# Patient Record
Sex: Female | Born: 1935 | Race: White | Hispanic: No | Marital: Married | Smoking: Never smoker
Health system: Southern US, Community
[De-identification: ages and names within clinical notes are randomized; demographics above are authoritative.]

## PROBLEM LIST (undated history)

## (undated) DIAGNOSIS — E119 Type 2 diabetes mellitus without complications: Secondary | ICD-10-CM

## (undated) DIAGNOSIS — I499 Cardiac arrhythmia, unspecified: Secondary | ICD-10-CM

## (undated) DIAGNOSIS — J302 Other seasonal allergic rhinitis: Secondary | ICD-10-CM

## (undated) DIAGNOSIS — I10 Essential (primary) hypertension: Secondary | ICD-10-CM

## (undated) DIAGNOSIS — K219 Gastro-esophageal reflux disease without esophagitis: Secondary | ICD-10-CM

## (undated) DIAGNOSIS — R011 Cardiac murmur, unspecified: Secondary | ICD-10-CM

## (undated) DIAGNOSIS — M1990 Unspecified osteoarthritis, unspecified site: Secondary | ICD-10-CM

## (undated) HISTORY — PX: ABDOMINAL HYSTERECTOMY: SHX81

## (undated) HISTORY — PX: EYE SURGERY: SHX253

## (undated) HISTORY — PX: CHOLECYSTECTOMY: SHX55

## (undated) HISTORY — PX: BLADDER SUSPENSION: SHX72

## (undated) HISTORY — PX: APPENDECTOMY: SHX54

---

## 1989-04-17 HISTORY — PX: BREAST BIOPSY: SHX20

## 2001-04-17 HISTORY — PX: BREAST BIOPSY: SHX20

## 2013-08-01 ENCOUNTER — Ambulatory Visit: Payer: Self-pay | Admitting: Family Medicine

## 2013-08-05 ENCOUNTER — Ambulatory Visit: Payer: Self-pay | Admitting: Family Medicine

## 2013-09-04 DIAGNOSIS — E119 Type 2 diabetes mellitus without complications: Secondary | ICD-10-CM | POA: Insufficient documentation

## 2013-09-04 DIAGNOSIS — E782 Mixed hyperlipidemia: Secondary | ICD-10-CM | POA: Insufficient documentation

## 2013-09-04 DIAGNOSIS — I10 Essential (primary) hypertension: Secondary | ICD-10-CM | POA: Insufficient documentation

## 2013-11-27 ENCOUNTER — Ambulatory Visit: Payer: Self-pay | Admitting: Otolaryngology

## 2014-08-20 ENCOUNTER — Other Ambulatory Visit: Payer: Self-pay | Admitting: Family Medicine

## 2014-08-20 DIAGNOSIS — Z780 Asymptomatic menopausal state: Secondary | ICD-10-CM

## 2014-08-25 ENCOUNTER — Other Ambulatory Visit: Payer: Self-pay

## 2014-08-25 DIAGNOSIS — Z1231 Encounter for screening mammogram for malignant neoplasm of breast: Secondary | ICD-10-CM

## 2014-09-22 ENCOUNTER — Ambulatory Visit
Admission: RE | Admit: 2014-09-22 | Discharge: 2014-09-22 | Disposition: A | Discharge status: Home / Self Care | Payer: Medicare Other | Source: Ambulatory Visit | Attending: Family Medicine | Admitting: Family Medicine

## 2014-09-22 ENCOUNTER — Ambulatory Visit
Admission: RE | Admit: 2014-09-22 | Discharge: 2014-09-22 | Disposition: A | Discharge status: Home / Self Care | Payer: Medicare Other | Source: Ambulatory Visit

## 2014-09-22 DIAGNOSIS — Z780 Asymptomatic menopausal state: Secondary | ICD-10-CM

## 2014-09-22 DIAGNOSIS — Z1231 Encounter for screening mammogram for malignant neoplasm of breast: Secondary | ICD-10-CM

## 2015-10-07 ENCOUNTER — Other Ambulatory Visit: Payer: Self-pay | Admitting: Family Medicine

## 2015-10-07 DIAGNOSIS — Z1231 Encounter for screening mammogram for malignant neoplasm of breast: Secondary | ICD-10-CM

## 2015-10-25 ENCOUNTER — Ambulatory Visit
Admission: RE | Admit: 2015-10-25 | Discharge: 2015-10-25 | Disposition: A | Discharge status: Home / Self Care | Payer: Medicare Other | Source: Ambulatory Visit | Attending: Family Medicine | Admitting: Family Medicine

## 2015-10-25 DIAGNOSIS — Z1231 Encounter for screening mammogram for malignant neoplasm of breast: Secondary | ICD-10-CM | POA: Insufficient documentation

## 2015-10-28 DIAGNOSIS — L298 Other pruritus: Secondary | ICD-10-CM | POA: Insufficient documentation

## 2016-05-03 ENCOUNTER — Encounter: Payer: Self-pay | Admitting: Emergency Medicine

## 2016-05-03 ENCOUNTER — Emergency Department
Admission: EM | Admit: 2016-05-03 | Discharge: 2016-05-03 | Disposition: A | Discharge status: Home / Self Care | Payer: Medicare Other | Attending: Emergency Medicine | Admitting: Emergency Medicine

## 2016-05-03 ENCOUNTER — Emergency Department: Payer: Medicare Other

## 2016-05-03 DIAGNOSIS — R51 Headache: Secondary | ICD-10-CM | POA: Insufficient documentation

## 2016-05-03 DIAGNOSIS — R55 Syncope and collapse: Secondary | ICD-10-CM | POA: Insufficient documentation

## 2016-05-03 DIAGNOSIS — I10 Essential (primary) hypertension: Secondary | ICD-10-CM | POA: Insufficient documentation

## 2016-05-03 DIAGNOSIS — E119 Type 2 diabetes mellitus without complications: Secondary | ICD-10-CM | POA: Insufficient documentation

## 2016-05-03 HISTORY — DX: Type 2 diabetes mellitus without complications: E11.9

## 2016-05-03 HISTORY — DX: Essential (primary) hypertension: I10

## 2016-05-03 LAB — URINALYSIS, COMPLETE (UACMP) WITH MICROSCOPIC
BILIRUBIN URINE: NEGATIVE
Bacteria, UA: NONE SEEN
Glucose, UA: NEGATIVE mg/dL
Hgb urine dipstick: NEGATIVE
KETONES UR: NEGATIVE mg/dL
LEUKOCYTES UA: NEGATIVE
Nitrite: NEGATIVE
PH: 6 (ref 5.0–8.0)
PROTEIN: NEGATIVE mg/dL
Specific Gravity, Urine: 1.01 (ref 1.005–1.030)

## 2016-05-03 LAB — BASIC METABOLIC PANEL
ANION GAP: 8 (ref 5–15)
BUN: 24 mg/dL — ABNORMAL HIGH (ref 6–20)
CHLORIDE: 103 mmol/L (ref 101–111)
CO2: 28 mmol/L (ref 22–32)
CREATININE: 0.93 mg/dL (ref 0.44–1.00)
Calcium: 8.8 mg/dL — ABNORMAL LOW (ref 8.9–10.3)
GFR calc non Af Amer: 56 mL/min — ABNORMAL LOW (ref >60)
Glucose, Bld: 148 mg/dL — ABNORMAL HIGH (ref 65–99)
POTASSIUM: 4.1 mmol/L (ref 3.5–5.1)
SODIUM: 139 mmol/L (ref 135–145)

## 2016-05-03 LAB — CBC
HEMATOCRIT: 38.5 % (ref 35.0–47.0)
HEMOGLOBIN: 13.1 g/dL (ref 12.0–16.0)
MCH: 31.7 pg (ref 26.0–34.0)
MCHC: 34 g/dL (ref 32.0–36.0)
MCV: 93.1 fL (ref 80.0–100.0)
PLATELETS: 351 10*3/uL (ref 150–440)
RBC: 4.14 MIL/uL (ref 3.80–5.20)
RDW: 13.7 % (ref 11.5–14.5)
WBC: 11.7 10*3/uL — AB (ref 3.6–11.0)

## 2016-05-03 LAB — TROPONIN I: Troponin I: <0.03 ng/mL (ref <0.03)

## 2016-05-03 NOTE — ED Notes (Signed)
Pt given 8 12oz cup of water. No other needs expressed.

## 2016-05-03 NOTE — ED Triage Notes (Signed)
Pt arrived via ems from home with complaints of a syncopal episode after getting up to use the bathroom. Pt denies hitting her head, reports hitting her right arm. EMS vitals; blood pressure 183/64, 97% on room air, 98.3 oral temp. Pt reports this has happened before when she was dehydrated. EMS has a bolus running through IV established in route.

## 2016-05-03 NOTE — ED Provider Notes (Signed)
[facility] Emergency Department Provider Note   ____________________________________________   I have reviewed the triage vital signs and the nursing notes.   HISTORY  Chief Complaint Loss of Consciousness   History limited by: Not Limited   HPI Sonya Matthews is a 83 y.o. female who presents to the emergency department today after a syncopal episode. The patient states that she was eating dinner when she started feeling unwell. The patient felt lightheaded. They checked her blood pressure was low. The patient then went to the bathroom. After having used the restroom she passed out. The patient was helped to the ground by her husband. She is not complaining of any pain or injuries from the fall. The patient denies any concurrent chest pain or palpitations. Denies similar symptoms in the past. In addition the patient has been complaining of a sinus headache for the past two days.   Past Medical History:  Diagnosis Date  . Diabetes mellitus without complication (HCC)   . Hypertension     There are no active problems to display for this patient.   Past Surgical History:  Procedure Laterality Date  . ABDOMINAL HYSTERECTOMY    . BREAST BIOPSY Left 2003   neg  . BREAST BIOPSY Left 1991   neg-needle bx  . CHOLECYSTECTOMY      Prior to Admission medications   Not on File    Allergies Patient has no allergy information on record.  Family History  Problem Relation Age of Onset  . Breast cancer Mother   . Breast cancer Maternal Aunt     Social History Social History  Substance Use Topics  . Smoking status: Never Smoker  . Smokeless tobacco: Never Used  . Alcohol use Yes    Review of Systems  Constitutional: Negative for fever. Cardiovascular: Negative for chest pain. Respiratory: Negative for shortness of breath. Gastrointestinal: Negative for abdominal pain, vomiting and diarrhea. Neurological: Positive for headache. 10-point ROS  otherwise negative.  ____________________________________________   PHYSICAL EXAM:  VITAL SIGNS: ED Triage Vitals  Enc Vitals Group     BP 05/03/16 1928 (!) 155/53     Pulse Rate 05/03/16 1928 68     Resp 05/03/16 1928 15     Temp --      Temp src --      SpO2 05/03/16 1928 96 %     Weight 05/03/16 1925 155 lb (70.3 kg)     Height 05/03/16 1925 5\' 4"  (1.626 m)    Constitutional: Alert and oriented. Well appearing and in no distress. Eyes: Conjunctivae are normal. Normal extraocular movements. ENT   Head: Normocephalic and atraumatic.   Nose: No congestion/rhinnorhea.   Mouth/Throat: Mucous membranes are moist.   Neck: No stridor. Hematological/Lymphatic/Immunilogical: No cervical lymphadenopathy. Cardiovascular: Normal rate, regular rhythm.  No murmurs, rubs, or gallops.  Respiratory: Normal respiratory effort without tachypnea nor retractions. Breath sounds are clear and equal bilaterally. No wheezes/rales/rhonchi. Gastrointestinal: Soft and non tender. No rebound. No guarding.  Genitourinary: Deferred Musculoskeletal: Normal range of motion in all extremities. No lower extremity edema. Neurologic:  Normal speech and language. No gross focal neurologic deficits are appreciated.  Skin:  Skin is warm, dry and intact. No rash noted. Psychiatric: Mood and affect are normal. Speech and behavior are normal. Patient exhibits appropriate insight and judgment.  ____________________________________________    LABS (pertinent positives/negatives)  Labs Reviewed  BASIC METABOLIC PANEL - Abnormal; Notable for the following:       Result Value  Glucose, Bld 148 (*)    BUN 24 (*)    Calcium 8.8 (*)    GFR calc non Af Amer 56 (*)    All other components within normal limits  CBC - Abnormal; Notable for the following:    WBC 11.7 (*)    All other components within normal limits  URINALYSIS, COMPLETE (UACMP) WITH MICROSCOPIC - Abnormal; Notable for the following:     Color, Urine YELLOW (*)    APPearance CLEAR (*)    Squamous Epithelial / LPF 0-5 (*)    All other components within normal limits  TROPONIN I  TROPONIN I  CBG MONITORING, ED     ____________________________________________   EKG  I, Phineas SemenGraydon Asjah Rauda, attending physician, personally viewed and interpreted this EKG  EKG Time: 1931 Rate: 69 Rhythm: normal sinus rhythm Axis: normal Intervals: qtc 420 QRS: narrow ST changes: no st elevation Impression: normal ekg   ____________________________________________    RADIOLOGY  CT head IMPRESSION:  Negative noncontrast head CT.    ___________________________________________   PROCEDURES  Procedures  ____________________________________________   INITIAL IMPRESSION / ASSESSMENT AND PLAN / ED COURSE  Pertinent labs & imaging results that were available during my care of the patient were reviewed by me and considered in my medical decision making (see chart for details).  Patient presented to the emergency department today because of concerns for a syncopal episode. The patient blood work and urine without concerning findings. Patient have 2 sets of troponin. Initially a head CT was obtained given patient's complaints of headache and this did not show any acute findings. The fact that this was around the time the patient is a bathroom and he wantedbagel. Did discuss with patient importance of following up with primary care doctor.  ____________________________________________   FINAL CLINICAL IMPRESSION(S) / ED DIAGNOSES  Final diagnoses:  Syncope, unspecified syncope type     Note: This dictation was prepared with Dragon dictation. Any transcriptional errors that result from this process are unintentional     Phineas SemenGraydon Reeda Soohoo, MD 05/04/16 [phone]

## 2016-05-03 NOTE — Discharge Instructions (Signed)
Please seek medical attention for any high fevers, chest pain, shortness of breath, change in behavior, persistent vomiting, bloody stool or any other new or concerning symptoms.  

## 2016-05-03 NOTE — ED Notes (Signed)
Pt given water 

## 2016-09-18 ENCOUNTER — Other Ambulatory Visit: Payer: Self-pay | Admitting: Family Medicine

## 2016-09-18 DIAGNOSIS — Z1231 Encounter for screening mammogram for malignant neoplasm of breast: Secondary | ICD-10-CM

## 2016-10-16 ENCOUNTER — Other Ambulatory Visit: Payer: Self-pay | Admitting: Family Medicine

## 2016-10-16 DIAGNOSIS — Z780 Asymptomatic menopausal state: Secondary | ICD-10-CM

## 2016-10-25 ENCOUNTER — Ambulatory Visit
Admission: RE | Admit: 2016-10-25 | Discharge: 2016-10-25 | Disposition: A | Discharge status: Home / Self Care | Payer: Medicare Other | Source: Ambulatory Visit | Attending: Family Medicine | Admitting: Family Medicine

## 2016-10-25 DIAGNOSIS — Z1231 Encounter for screening mammogram for malignant neoplasm of breast: Secondary | ICD-10-CM

## 2016-11-14 ENCOUNTER — Ambulatory Visit
Admission: RE | Admit: 2016-11-14 | Discharge: 2016-11-14 | Disposition: A | Discharge status: Home / Self Care | Payer: Medicare Other | Source: Ambulatory Visit | Attending: Family Medicine | Admitting: Family Medicine

## 2016-11-14 ENCOUNTER — Other Ambulatory Visit (INDEPENDENT_AMBULATORY_CARE_PROVIDER_SITE_OTHER): Payer: Self-pay | Admitting: Family Medicine

## 2016-11-14 DIAGNOSIS — M85852 Other specified disorders of bone density and structure, left thigh: Secondary | ICD-10-CM | POA: Diagnosis not present

## 2016-11-14 DIAGNOSIS — E119 Type 2 diabetes mellitus without complications: Secondary | ICD-10-CM | POA: Insufficient documentation

## 2016-11-14 DIAGNOSIS — M7989 Other specified soft tissue disorders: Secondary | ICD-10-CM

## 2016-11-14 DIAGNOSIS — I714 Abdominal aortic aneurysm, without rupture, unspecified: Secondary | ICD-10-CM

## 2016-11-14 DIAGNOSIS — Z780 Asymptomatic menopausal state: Secondary | ICD-10-CM | POA: Diagnosis not present

## 2016-11-14 DIAGNOSIS — R0989 Other specified symptoms and signs involving the circulatory and respiratory systems: Secondary | ICD-10-CM

## 2016-11-15 ENCOUNTER — Ambulatory Visit (INDEPENDENT_AMBULATORY_CARE_PROVIDER_SITE_OTHER): Payer: Medicare Other

## 2016-11-15 DIAGNOSIS — M7989 Other specified soft tissue disorders: Secondary | ICD-10-CM | POA: Diagnosis not present

## 2016-11-15 DIAGNOSIS — I714 Abdominal aortic aneurysm, without rupture, unspecified: Secondary | ICD-10-CM

## 2016-11-15 DIAGNOSIS — R0989 Other specified symptoms and signs involving the circulatory and respiratory systems: Secondary | ICD-10-CM

## 2017-01-01 ENCOUNTER — Telehealth (INDEPENDENT_AMBULATORY_CARE_PROVIDER_SITE_OTHER): Payer: Self-pay | Admitting: Vascular Surgery

## 2017-01-01 NOTE — Telephone Encounter (Signed)
I spoke with Raynelle Fanning from Dr Ether Griffins office and will be faxing the ultrasounds from 11/15/16

## 2017-01-01 NOTE — Telephone Encounter (Signed)
DR Alda Ponder IS WANTING JEANS RECORDS FAXED OVER TO THEM, HER NUMBER IS [phone]

## 2017-02-25 DIAGNOSIS — J309 Allergic rhinitis, unspecified: Secondary | ICD-10-CM | POA: Insufficient documentation

## 2017-02-25 DIAGNOSIS — R600 Localized edema: Secondary | ICD-10-CM | POA: Insufficient documentation

## 2017-06-17 ENCOUNTER — Encounter: Payer: Self-pay | Admitting: Gynecology

## 2017-06-17 ENCOUNTER — Ambulatory Visit
Admission: EM | Admit: 2017-06-17 | Discharge: 2017-06-17 | Disposition: A | Discharge status: Home / Self Care | Payer: Medicare Other | Attending: Family Medicine | Admitting: Family Medicine

## 2017-06-17 ENCOUNTER — Other Ambulatory Visit: Payer: Self-pay

## 2017-06-17 DIAGNOSIS — M25562 Pain in left knee: Secondary | ICD-10-CM | POA: Diagnosis not present

## 2017-06-17 DIAGNOSIS — S83412A Sprain of medial collateral ligament of left knee, initial encounter: Secondary | ICD-10-CM | POA: Diagnosis not present

## 2017-06-17 NOTE — Discharge Instructions (Signed)
Rest , ice, tylenol

## 2017-06-17 NOTE — ED Triage Notes (Signed)
Per patient was doing water aerobics x 4 days ago and now with left knee pain.

## 2017-06-17 NOTE — ED Provider Notes (Signed)
[facility]    CSN: [id number] Arrival date & time: 06/17/17  0854     History   Chief Complaint Chief Complaint  Patient presents with  . Knee Pain    HPI Sonya Matthews is a 84 y.o. female.   The history is provided by the patient.  Knee Pain  Location:  Knee Time since incident:  3 days Injury: yes (thinks she may have injured it (twisted) while doing water aerobics the day prior to symptoms starting; denies any fall or traumatic injury)   Knee location:  L knee Chronicity:  New Dislocation: no   Prior injury to area:  No Relieved by:  Acetaminophen and ice Associated symptoms: swelling   Associated symptoms: no back pain, no decreased ROM, no fatigue, no fever, no itching, no muscle weakness, no neck pain, no numbness, no stiffness and no tingling     Past Medical History:  Diagnosis Date  . Diabetes mellitus without complication (HCC)   . Hypertension     There are no active problems to display for this patient.   Past Surgical History:  Procedure Laterality Date  . ABDOMINAL HYSTERECTOMY    . BREAST BIOPSY Left 2003   neg  . BREAST BIOPSY Left 1991   neg-needle bx  . CHOLECYSTECTOMY      OB History    No data available       Home Medications    Prior to Admission medications   Medication Sig Start Date End Date Taking? Authorizing Provider  amLODipine (NORVASC) 10 MG tablet Take 10 mg by mouth daily.   Yes [provider]  aspirin EC 81 MG tablet Take 81 mg by mouth daily.   Yes [provider]  atorvastatin (LIPITOR) 10 MG tablet Take 10 mg by mouth daily.   Yes [provider]  cetirizine (ZYRTEC) 10 MG tablet Take 10 mg by mouth daily.   Yes [provider]  Clobetasol Propionate 0.05 % lotion Apply topically 2 (two) times daily.   Yes [provider]  doxepin (SINEQUAN) 50 MG capsule Take 50 mg by mouth.   Yes [provider]  fluticasone (FLONASE) 50 MCG/ACT nasal spray Place  into both nostrils daily.   Yes [provider]  gabapentin (NEURONTIN) 300 MG capsule Take 300 mg by mouth 3 (three) times daily.   Yes [provider]  losartan (COZAAR) 100 MG tablet Take 100 mg by mouth daily.   Yes [provider]  montelukast (SINGULAIR) 10 MG tablet Take 10 mg by mouth at bedtime.   Yes [provider]  naproxen sodium (ALEVE) 220 MG tablet Take 220 mg by mouth.   Yes [provider]  spironolactone (ALDACTONE) 25 MG tablet Take 25 mg by mouth daily.   Yes [provider]  EPINEPHrine (EPIPEN 2-PAK) 0.3 mg/0.3 mL IJ SOAJ injection Inject into the muscle once.    [provider]    Family History Family History  Problem Relation Age of Onset  . Breast cancer Mother   . Breast cancer Maternal Aunt     Social History Social History   Tobacco Use  . Smoking status: Never Smoker  . Smokeless tobacco: Never Used  Substance Use Topics  . Alcohol use: Yes  . Drug use: No     Allergies   Patient has no known allergies.   Review of Systems Review of Systems  Constitutional: Negative for fatigue and fever.  Musculoskeletal: Negative for back pain, neck  pain and stiffness.  Skin: Negative for itching.     Physical Exam Triage Vital Signs ED Triage Vitals  Enc Vitals Group     BP 06/17/17 0943 (!) 150/42     Pulse Rate 06/17/17 0943 63     Resp 06/17/17 0943 16     Temp 06/17/17 0943 98 F (36.7 C)     Temp Source 06/17/17 0943 Oral     SpO2 06/17/17 0943 98 %     Weight 06/17/17 0941 157 lb (71.2 kg)     Height 06/17/17 0941 5\' 3"  (1.6 m)     Head Circumference --      Peak Flow --      Pain Score 06/17/17 0940 4     Pain Loc --      Pain Edu? --      Excl. in GC? --    No data found.  Updated Vital Signs BP (!) 150/42   Pulse 63   Temp 98 F (36.7 C) (Oral)   Resp 16   Ht 5\' 3"  (1.6 m)   Wt 157 lb (71.2 kg)   SpO2 98%   BMI 27.81 kg/m   Visual Acuity Right Eye  Distance:   Left Eye Distance:   Bilateral Distance:    Right Eye Near:   Left Eye Near:    Bilateral Near:     Physical Exam  Constitutional: She appears well-developed and well-nourished. No distress.  Musculoskeletal:       Left knee: She exhibits swelling (mild). She exhibits normal range of motion, no effusion, no ecchymosis, no deformity, no laceration, no erythema, normal alignment, no LCL laxity, normal patellar mobility, no bony tenderness, normal meniscus and no MCL laxity. Tenderness found. MCL tenderness noted.  Skin: She is not diaphoretic.  Nursing note and vitals reviewed.    UC Treatments / Results  Labs (all labs ordered are listed, but only abnormal results are displayed) Labs Reviewed - No data to display  EKG  EKG Interpretation None       Radiology No results found.  Procedures Procedures (including critical care time)  Medications Ordered in UC Medications - No data to display   Initial Impression / Assessment and Plan / UC Course  I have reviewed the triage vital signs and the nursing notes.  Pertinent labs & imaging results that were available during my care of the patient were reviewed by me and considered in my medical decision making (see chart for details).       Final Clinical Impressions(s) / UC Diagnoses   Final diagnoses:  Sprain of medial collateral ligament of left knee, initial encounter    ED Discharge Orders    None     1. diagnosis reviewed with patient 2. Recommend supportive treatment with rest, ice, elevation, otc analgesics prn 3. Follow-up prn if symptoms worsen or don't improve  Controlled Substance Prescriptions [place] Controlled Substance Registry consulted? Not Applicable   Payton Mccallumonty, Jireh Elmore, MD 06/17/17 1141

## 2017-07-23 DIAGNOSIS — L239 Allergic contact dermatitis, unspecified cause: Secondary | ICD-10-CM | POA: Insufficient documentation

## 2017-10-15 ENCOUNTER — Other Ambulatory Visit: Payer: Self-pay | Admitting: Family Medicine

## 2017-10-15 DIAGNOSIS — Z1239 Encounter for other screening for malignant neoplasm of breast: Secondary | ICD-10-CM

## 2017-10-24 ENCOUNTER — Other Ambulatory Visit: Payer: Self-pay | Admitting: Orthopedic Surgery

## 2017-10-24 DIAGNOSIS — M25362 Other instability, left knee: Secondary | ICD-10-CM

## 2017-11-05 ENCOUNTER — Inpatient Hospital Stay: Admission: RE | Admit: 2017-11-05 | Payer: Medicare Other | Source: Ambulatory Visit

## 2017-11-07 ENCOUNTER — Ambulatory Visit
Admission: RE | Admit: 2017-11-07 | Discharge: 2017-11-07 | Disposition: A | Discharge status: Home / Self Care | Payer: Medicare Other | Source: Ambulatory Visit | Attending: Orthopedic Surgery | Admitting: Orthopedic Surgery

## 2017-11-07 DIAGNOSIS — M25462 Effusion, left knee: Secondary | ICD-10-CM | POA: Insufficient documentation

## 2017-11-07 DIAGNOSIS — M25562 Pain in left knee: Secondary | ICD-10-CM | POA: Diagnosis present

## 2017-11-07 DIAGNOSIS — M25362 Other instability, left knee: Secondary | ICD-10-CM | POA: Insufficient documentation

## 2017-11-07 DIAGNOSIS — M1712 Unilateral primary osteoarthritis, left knee: Secondary | ICD-10-CM | POA: Diagnosis not present

## 2017-11-07 DIAGNOSIS — S83232A Complex tear of medial meniscus, current injury, left knee, initial encounter: Secondary | ICD-10-CM | POA: Diagnosis not present

## 2017-11-07 DIAGNOSIS — M7122 Synovial cyst of popliteal space [Baker], left knee: Secondary | ICD-10-CM | POA: Insufficient documentation

## 2017-11-07 DIAGNOSIS — G8929 Other chronic pain: Secondary | ICD-10-CM | POA: Diagnosis present

## 2017-11-13 ENCOUNTER — Ambulatory Visit
Admission: RE | Admit: 2017-11-13 | Discharge: 2017-11-13 | Disposition: A | Discharge status: Home / Self Care | Payer: Medicare Other | Source: Ambulatory Visit | Attending: Family Medicine | Admitting: Family Medicine

## 2017-11-13 DIAGNOSIS — Z1239 Encounter for other screening for malignant neoplasm of breast: Secondary | ICD-10-CM

## 2017-11-13 DIAGNOSIS — Z1231 Encounter for screening mammogram for malignant neoplasm of breast: Secondary | ICD-10-CM | POA: Diagnosis present

## 2017-11-20 DIAGNOSIS — M1712 Unilateral primary osteoarthritis, left knee: Secondary | ICD-10-CM | POA: Insufficient documentation

## 2017-12-04 ENCOUNTER — Other Ambulatory Visit: Payer: Self-pay | Admitting: Unknown Physician Specialty

## 2017-12-04 DIAGNOSIS — M5441 Lumbago with sciatica, right side: Principal | ICD-10-CM

## 2017-12-04 DIAGNOSIS — G8929 Other chronic pain: Secondary | ICD-10-CM

## 2017-12-04 DIAGNOSIS — M545 Low back pain, unspecified: Secondary | ICD-10-CM | POA: Insufficient documentation

## 2017-12-20 ENCOUNTER — Ambulatory Visit
Admission: RE | Admit: 2017-12-20 | Discharge: 2017-12-20 | Disposition: A | Discharge status: Home / Self Care | Payer: Medicare Other | Source: Ambulatory Visit | Attending: Unknown Physician Specialty | Admitting: Unknown Physician Specialty

## 2017-12-20 ENCOUNTER — Encounter (INDEPENDENT_AMBULATORY_CARE_PROVIDER_SITE_OTHER): Payer: Self-pay

## 2017-12-20 DIAGNOSIS — M47816 Spondylosis without myelopathy or radiculopathy, lumbar region: Secondary | ICD-10-CM | POA: Insufficient documentation

## 2017-12-20 DIAGNOSIS — M5441 Lumbago with sciatica, right side: Secondary | ICD-10-CM | POA: Insufficient documentation

## 2017-12-20 DIAGNOSIS — M48061 Spinal stenosis, lumbar region without neurogenic claudication: Secondary | ICD-10-CM | POA: Diagnosis not present

## 2017-12-20 DIAGNOSIS — G8929 Other chronic pain: Secondary | ICD-10-CM

## 2017-12-20 DIAGNOSIS — M5126 Other intervertebral disc displacement, lumbar region: Secondary | ICD-10-CM | POA: Insufficient documentation

## 2017-12-20 DIAGNOSIS — M4807 Spinal stenosis, lumbosacral region: Secondary | ICD-10-CM | POA: Insufficient documentation

## 2017-12-20 DIAGNOSIS — M5440 Lumbago with sciatica, unspecified side: Secondary | ICD-10-CM | POA: Diagnosis present

## 2018-05-17 DIAGNOSIS — R079 Chest pain, unspecified: Secondary | ICD-10-CM | POA: Insufficient documentation

## 2018-05-17 DIAGNOSIS — R002 Palpitations: Secondary | ICD-10-CM | POA: Insufficient documentation

## 2020-01-08 ENCOUNTER — Other Ambulatory Visit: Payer: Self-pay | Admitting: Surgery

## 2020-01-23 ENCOUNTER — Other Ambulatory Visit: Payer: Self-pay

## 2020-01-23 ENCOUNTER — Encounter
Admission: RE | Admit: 2020-01-23 | Discharge: 2020-01-23 | Disposition: A | Discharge status: Home / Self Care | Payer: Medicare Other | Source: Ambulatory Visit | Attending: Surgery | Admitting: Surgery

## 2020-01-23 DIAGNOSIS — Z01818 Encounter for other preprocedural examination: Secondary | ICD-10-CM | POA: Insufficient documentation

## 2020-01-23 HISTORY — DX: Gastro-esophageal reflux disease without esophagitis: K21.9

## 2020-01-23 HISTORY — DX: Cardiac murmur, unspecified: R01.1

## 2020-01-23 HISTORY — DX: Unspecified osteoarthritis, unspecified site: M19.90

## 2020-01-23 HISTORY — DX: Cardiac arrhythmia, unspecified: I49.9

## 2020-01-23 HISTORY — DX: Other seasonal allergic rhinitis: J30.2

## 2020-01-23 LAB — COMPREHENSIVE METABOLIC PANEL
ALT: 26 U/L (ref 0–44)
AST: 27 U/L (ref 15–41)
Albumin: 4.1 g/dL (ref 3.5–5.0)
Alkaline Phosphatase: 64 U/L (ref 38–126)
Anion gap: 10 (ref 5–15)
BUN: 23 mg/dL (ref 8–23)
CO2: 24 mmol/L (ref 22–32)
Calcium: 8.9 mg/dL (ref 8.9–10.3)
Chloride: 103 mmol/L (ref 98–111)
Creatinine, Ser: 0.7 mg/dL (ref 0.44–1.00)
GFR, Estimated: >60 mL/min (ref >60)
Glucose, Bld: 114 mg/dL — ABNORMAL HIGH (ref 70–99)
Potassium: 3.9 mmol/L (ref 3.5–5.1)
Sodium: 137 mmol/L (ref 135–145)
Total Bilirubin: 0.7 mg/dL (ref 0.3–1.2)
Total Protein: 7.8 g/dL (ref 6.5–8.1)

## 2020-01-23 LAB — URINALYSIS, ROUTINE W REFLEX MICROSCOPIC
Bilirubin Urine: NEGATIVE
Glucose, UA: NEGATIVE mg/dL
Hgb urine dipstick: NEGATIVE
Ketones, ur: NEGATIVE mg/dL
Leukocytes,Ua: NEGATIVE
Nitrite: NEGATIVE
Protein, ur: NEGATIVE mg/dL
Specific Gravity, Urine: 1.013 (ref 1.005–1.030)
pH: 6 (ref 5.0–8.0)

## 2020-01-23 LAB — CBC WITH DIFFERENTIAL/PLATELET
Abs Immature Granulocytes: 0.02 10*3/uL (ref 0.00–0.07)
Basophils Absolute: 0.1 10*3/uL (ref 0.0–0.1)
Basophils Relative: 1 %
Eosinophils Absolute: 0.9 10*3/uL — ABNORMAL HIGH (ref 0.0–0.5)
Eosinophils Relative: 12 %
HCT: 37.7 % (ref 36.0–46.0)
Hemoglobin: 12.4 g/dL (ref 12.0–15.0)
Immature Granulocytes: 0 %
Lymphocytes Relative: 17 %
Lymphs Abs: 1.3 10*3/uL (ref 0.7–4.0)
MCH: 31.2 pg (ref 26.0–34.0)
MCHC: 32.9 g/dL (ref 30.0–36.0)
MCV: 94.7 fL (ref 80.0–100.0)
Monocytes Absolute: 0.8 10*3/uL (ref 0.1–1.0)
Monocytes Relative: 11 %
Neutro Abs: 4.4 10*3/uL (ref 1.7–7.7)
Neutrophils Relative %: 59 %
Platelets: 317 10*3/uL (ref 150–400)
RBC: 3.98 MIL/uL (ref 3.87–5.11)
RDW: 13 % (ref 11.5–15.5)
WBC: 7.5 10*3/uL (ref 4.0–10.5)
nRBC: 0 % (ref 0.0–0.2)

## 2020-01-23 LAB — SURGICAL PCR SCREEN
MRSA, PCR: NEGATIVE
Staphylococcus aureus: NEGATIVE

## 2020-01-23 NOTE — Patient Instructions (Signed)
Your procedure is scheduled on:  Thurs. 10/21 Report to Day Surgery. To find out your arrival time please call [phone] between 1PM - 3PM on Wed.10/20.  Remember: Instructions that are not followed completely may result in serious medical risk,  up to and including death, or upon the discretion of your surgeon and anesthesiologist your  surgery may need to be rescheduled.     _X__ 1. Do not eat food after midnight the night before your procedure.                 No chewing gum or hard candies. You may drink clear liquids up to 2 hours                 before you are scheduled to arrive for your surgery- DO not drink clear                 liquids within 2 hours of the start of your surgery.                 Clear Liquids include:  water, apple juice without pulp, clear Gatorade, G2 or                  Gatorade Zero (avoid Red/Purple/Blue), Black Coffee or Tea (Do not add                 anything to coffee or tea). _x____2.   Complete the "Ensure Clear Pre-surgery Clear Carbohydrate Drink" provided to you, 2 hours before arrival. **If you       are diabetic you will be provided with an alternative drink, Gatorade Zero or G2.  __X__2.  On the morning of surgery brush your teeth with toothpaste and water, you                may rinse your mouth with mouthwash if you wish.  Do not swallow any toothpaste of mouthwash.     _X__ 3.  No Alcohol for 24 hours before or after surgery.   ___ 4.  Do Not Smoke or use e-cigarettes For 24 Hours Prior to Your Surgery.                 Do not use any chewable tobacco products for at least 6 hours prior to                 Surgery.  ___  5.  Do not use any recreational drugs (marijuana, cocaine, heroin, ecstasy, MDMA or other)                For at least one week prior to your surgery.  Combination of these drugs with anesthesia                May have life threatening results.  ____  6.  Bring all medications with you on the day  of surgery if instructed.   _x___  7.  Notify your doctor if there is any change in your medical condition      (cold, fever, infections).     Do not wear jewelry, make-up, hairpins, clips or nail polish. Do not wear lotions, powders, or perfumes. You may wear deodorant. Do not shave 48 hours prior to surgery. Do not bring valuables to the hospital.    [facility] is not responsible for any belongings or valuables.  Contacts, dentures or bridgework may not be worn into surgery. Leave your suitcase in the car. After surgery  it may be brought to your room. For patients admitted to the hospital, discharge time is determined by your treatment team.   Patients discharged the day of surgery will not be allowed to drive home.   Make arrangements for someone to be with you for the first 24 hours of your Same Day Discharge.    Please read over the following fact sheets that you were given:   Incentive Spirometer  _x___ Take these medicines the morning of surgery with A SIP OF WATER:    1. none  2.   3.   4.  5.  6.  ____ Fleet Enema (as directed)   __x__ Use CHG Soap (or wipes) as directed  ____ Use Benzoyl Peroxide Gel as instructed  ____ Use inhalers on the day of surgery  ____ Stop metformin 2 days prior to surgery    ____ Take 1/2 of usual insulin dose the night before surgery. No insulin the morning          of surgery.   _x___ Stop aspirin on 10/14  _x___ Stop Anti-inflammatories  No ibuprofen aleve or aspirin products on 10/14     May take tylenol   ____ Stop supplements until after surgery.    ____ Bring C-Pap to the hospital.    If you have any questions regarding your pre-procedure instructions,  Please call Pre-admit Testing at [phone]

## 2020-02-03 ENCOUNTER — Other Ambulatory Visit: Payer: Self-pay

## 2020-02-03 ENCOUNTER — Other Ambulatory Visit
Admission: RE | Admit: 2020-02-03 | Discharge: 2020-02-03 | Disposition: A | Discharge status: Home / Self Care | Payer: Medicare Other | Source: Ambulatory Visit | Attending: Surgery | Admitting: Surgery

## 2020-02-03 DIAGNOSIS — Z20822 Contact with and (suspected) exposure to covid-19: Secondary | ICD-10-CM | POA: Insufficient documentation

## 2020-02-03 DIAGNOSIS — Z01812 Encounter for preprocedural laboratory examination: Secondary | ICD-10-CM | POA: Insufficient documentation

## 2020-02-04 LAB — SARS CORONAVIRUS 2 (TAT 6-24 HRS): SARS Coronavirus 2: NEGATIVE

## 2020-02-04 MED ORDER — LACTATED RINGERS IV SOLN: INTRAVENOUS | Status: DC

## 2020-02-04 MED ORDER — ORAL CARE MOUTH RINSE: 15.0000 mL | Freq: Once | OROMUCOSAL | Status: AC

## 2020-02-04 MED ORDER — FAMOTIDINE 20 MG PO TABS: 20.0000 mg | ORAL_TABLET | Freq: Once | ORAL | Status: AC

## 2020-02-04 MED ORDER — CEFAZOLIN SODIUM-DEXTROSE 2-4 GM/100ML-% IV SOLN
2.0000 g | INTRAVENOUS | Status: AC
  Administered 2020-02-05: 2 g via INTRAVENOUS

## 2020-02-04 MED ORDER — CHLORHEXIDINE GLUCONATE 0.12 % MT SOLN: 15.0000 mL | Freq: Once | OROMUCOSAL | Status: AC

## 2020-02-05 ENCOUNTER — Ambulatory Visit: Payer: Medicare Other | Admitting: Certified Registered"

## 2020-02-05 ENCOUNTER — Encounter
Admission: RE | Disposition: A | Discharge status: Home / Self Care | Payer: Self-pay | Source: Home / Self Care | Attending: Surgery

## 2020-02-05 ENCOUNTER — Ambulatory Visit: Payer: Medicare Other

## 2020-02-05 ENCOUNTER — Encounter: Payer: Self-pay | Admitting: Surgery

## 2020-02-05 ENCOUNTER — Ambulatory Visit
Admission: RE | Admit: 2020-02-05 | Discharge: 2020-02-05 | Disposition: A | Discharge status: Home / Self Care | Payer: Medicare Other | Attending: Surgery | Admitting: Surgery

## 2020-02-05 ENCOUNTER — Other Ambulatory Visit: Payer: Self-pay

## 2020-02-05 DIAGNOSIS — Z888 Allergy status to other drugs, medicaments and biological substances status: Secondary | ICD-10-CM | POA: Diagnosis not present

## 2020-02-05 DIAGNOSIS — M1712 Unilateral primary osteoarthritis, left knee: Secondary | ICD-10-CM | POA: Insufficient documentation

## 2020-02-05 DIAGNOSIS — Z96652 Presence of left artificial knee joint: Secondary | ICD-10-CM

## 2020-02-05 DIAGNOSIS — E119 Type 2 diabetes mellitus without complications: Secondary | ICD-10-CM | POA: Diagnosis not present

## 2020-02-05 HISTORY — PX: PARTIAL KNEE ARTHROPLASTY: SHX2174

## 2020-02-05 LAB — GLUCOSE, CAPILLARY
Glucose-Capillary: 114 mg/dL — ABNORMAL HIGH (ref 70–99)
Glucose-Capillary: 128 mg/dL — ABNORMAL HIGH (ref 70–99)
Glucose-Capillary: 154 mg/dL — ABNORMAL HIGH (ref 70–99)

## 2020-02-05 SURGERY — ARTHROPLASTY, KNEE, UNICOMPARTMENTAL
Anesthesia: Choice | Site: Knee | Laterality: Left

## 2020-02-05 MED ORDER — ONDANSETRON HCL 4 MG/2ML IJ SOLN: 4.0000 mg | Freq: Once | INTRAMUSCULAR | Status: DC | PRN

## 2020-02-05 MED ORDER — CEFAZOLIN SODIUM-DEXTROSE 2-4 GM/100ML-% IV SOLN
INTRAVENOUS | Status: AC
  Filled 2020-02-05: qty 100

## 2020-02-05 MED ORDER — TRAMADOL HCL 50 MG PO TABS: 50.0000 mg | ORAL_TABLET | Freq: Four times a day (QID) | ORAL | Status: DC | PRN

## 2020-02-05 MED ORDER — OXYCODONE HCL 5 MG PO TABS
ORAL_TABLET | ORAL | Status: AC
  Filled 2020-02-05: qty 1

## 2020-02-05 MED ORDER — EPHEDRINE SULFATE 50 MG/ML IJ SOLN
INTRAMUSCULAR | Status: DC | PRN
  Administered 2020-02-05: 20 mg via INTRAVENOUS

## 2020-02-05 MED ORDER — PROPOFOL 10 MG/ML IV BOLUS
INTRAVENOUS | Status: AC
  Filled 2020-02-05: qty 20

## 2020-02-05 MED ORDER — PROPOFOL 500 MG/50ML IV EMUL
INTRAVENOUS | Status: AC
  Filled 2020-02-05: qty 50

## 2020-02-05 MED ORDER — TRANEXAMIC ACID 1000 MG/10ML IV SOLN
INTRAVENOUS | Status: AC
  Filled 2020-02-05: qty 10

## 2020-02-05 MED ORDER — OXYCODONE HCL 5 MG/5ML PO SOLN: 5.0000 mg | Freq: Once | ORAL | Status: DC | PRN

## 2020-02-05 MED ORDER — FENTANYL CITRATE (PF) 100 MCG/2ML IJ SOLN: 25.0000 ug | INTRAMUSCULAR | Status: DC | PRN

## 2020-02-05 MED ORDER — BUPIVACAINE LIPOSOME 1.3 % IJ SUSP
INTRAMUSCULAR | Status: AC
  Filled 2020-02-05: qty 20

## 2020-02-05 MED ORDER — ONDANSETRON HCL 4 MG/2ML IJ SOLN: 4.0000 mg | Freq: Four times a day (QID) | INTRAMUSCULAR | Status: DC | PRN

## 2020-02-05 MED ORDER — BUPIVACAINE LIPOSOME 1.3 % IJ SUSP
INTRAMUSCULAR | Status: DC | PRN
  Administered 2020-02-05: 20 mL

## 2020-02-05 MED ORDER — EPHEDRINE 5 MG/ML INJ
INTRAVENOUS | Status: AC
  Filled 2020-02-05: qty 10

## 2020-02-05 MED ORDER — ONDANSETRON HCL 4 MG PO TABS: 4.0000 mg | ORAL_TABLET | Freq: Four times a day (QID) | ORAL | Status: DC | PRN

## 2020-02-05 MED ORDER — METOCLOPRAMIDE HCL 5 MG/ML IJ SOLN: 5.0000 mg | Freq: Three times a day (TID) | INTRAMUSCULAR | Status: DC | PRN

## 2020-02-05 MED ORDER — CEFAZOLIN SODIUM-DEXTROSE 2-4 GM/100ML-% IV SOLN: 2.0000 g | Freq: Four times a day (QID) | INTRAVENOUS | Status: DC

## 2020-02-05 MED ORDER — BUPIVACAINE-EPINEPHRINE (PF) 0.5% -1:200000 IJ SOLN
INTRAMUSCULAR | Status: DC | PRN
  Administered 2020-02-05: 30 mL

## 2020-02-05 MED ORDER — SODIUM CHLORIDE 0.9 % BOLUS PEDS
250.0000 mL | Freq: Once | INTRAVENOUS | Status: AC
  Administered 2020-02-05: 250 mL via INTRAVENOUS

## 2020-02-05 MED ORDER — OXYCODONE HCL 5 MG PO TABS: 5.0000 mg | ORAL_TABLET | Freq: Once | ORAL | Status: DC | PRN

## 2020-02-05 MED ORDER — SODIUM CHLORIDE FLUSH 0.9 % IV SOLN
INTRAVENOUS | Status: AC
  Filled 2020-02-05: qty 10

## 2020-02-05 MED ORDER — ACETAMINOPHEN 500 MG PO TABS: 1000.0000 mg | ORAL_TABLET | Freq: Four times a day (QID) | ORAL | Status: DC

## 2020-02-05 MED ORDER — CHLORHEXIDINE GLUCONATE 0.12 % MT SOLN
OROMUCOSAL | Status: AC
  Administered 2020-02-05: 15 mL via OROMUCOSAL
  Filled 2020-02-05: qty 15

## 2020-02-05 MED ORDER — SODIUM CHLORIDE 0.9 % IV SOLN: INTRAVENOUS | Status: DC | PRN

## 2020-02-05 MED ORDER — BUPIVACAINE HCL (PF) 0.5 % IJ SOLN
INTRAMUSCULAR | Status: AC
  Filled 2020-02-05: qty 10

## 2020-02-05 MED ORDER — KETOROLAC TROMETHAMINE 15 MG/ML IJ SOLN: 15.0000 mg | Freq: Once | INTRAMUSCULAR | Status: AC

## 2020-02-05 MED ORDER — SODIUM CHLORIDE 0.9 % BOLUS PEDS: 250.0000 mL | Freq: Once | INTRAVENOUS | Status: DC

## 2020-02-05 MED ORDER — CEFAZOLIN SODIUM-DEXTROSE 2-4 GM/100ML-% IV SOLN
INTRAVENOUS | Status: AC
  Administered 2020-02-05: 2000 mg
  Filled 2020-02-05: qty 100

## 2020-02-05 MED ORDER — FENTANYL CITRATE (PF) 100 MCG/2ML IJ SOLN
INTRAMUSCULAR | Status: AC
  Filled 2020-02-05: qty 2

## 2020-02-05 MED ORDER — SODIUM CHLORIDE (PF) 0.9 % IJ SOLN
INTRAMUSCULAR | Status: DC | PRN
  Administered 2020-02-05: 10 mL

## 2020-02-05 MED ORDER — FENTANYL CITRATE (PF) 100 MCG/2ML IJ SOLN
INTRAMUSCULAR | Status: DC | PRN
  Administered 2020-02-05: 100 ug via INTRAVENOUS

## 2020-02-05 MED ORDER — ACETAMINOPHEN 10 MG/ML IV SOLN
INTRAVENOUS | Status: AC
  Filled 2020-02-05: qty 100

## 2020-02-05 MED ORDER — APIXABAN 2.5 MG PO TABS: 2.5000 mg | ORAL_TABLET | Freq: Two times a day (BID) | ORAL | 0 refills | Status: AC

## 2020-02-05 MED ORDER — KETOROLAC TROMETHAMINE 15 MG/ML IJ SOLN
INTRAMUSCULAR | Status: AC
  Administered 2020-02-05: 15 mg via INTRAVENOUS
  Filled 2020-02-05: qty 1

## 2020-02-05 MED ORDER — ACETAMINOPHEN 10 MG/ML IV SOLN
INTRAVENOUS | Status: DC | PRN
  Administered 2020-02-05: 1000 mg via INTRAVENOUS

## 2020-02-05 MED ORDER — LIDOCAINE HCL (PF) 2 % IJ SOLN
INTRAMUSCULAR | Status: AC
  Filled 2020-02-05: qty 5

## 2020-02-05 MED ORDER — METOCLOPRAMIDE HCL 10 MG PO TABS: 5.0000 mg | ORAL_TABLET | Freq: Three times a day (TID) | ORAL | Status: DC | PRN

## 2020-02-05 MED ORDER — PROPOFOL 500 MG/50ML IV EMUL
INTRAVENOUS | Status: DC | PRN
  Administered 2020-02-05: 80 ug/kg/min via INTRAVENOUS

## 2020-02-05 MED ORDER — EPINEPHRINE PF 1 MG/ML IJ SOLN
INTRAMUSCULAR | Status: AC
  Filled 2020-02-05: qty 2

## 2020-02-05 MED ORDER — BUPIVACAINE HCL (PF) 0.5 % IJ SOLN
INTRAMUSCULAR | Status: AC
  Filled 2020-02-05: qty 60

## 2020-02-05 MED ORDER — ACETAMINOPHEN 10 MG/ML IV SOLN: 1000.0000 mg | Freq: Once | INTRAVENOUS | Status: DC | PRN

## 2020-02-05 MED ORDER — OXYCODONE HCL 5 MG PO TABS
5.0000 mg | ORAL_TABLET | ORAL | Status: DC | PRN
  Administered 2020-02-05 (×2): 5 mg via ORAL

## 2020-02-05 MED ORDER — OXYCODONE HCL 5 MG PO TABS
5.0000 mg | ORAL_TABLET | ORAL | 0 refills | Status: DC | PRN
Start: 2020-02-05 — End: 2021-01-08

## 2020-02-05 MED ORDER — FAMOTIDINE 20 MG PO TABS
ORAL_TABLET | ORAL | Status: AC
  Administered 2020-02-05: 20 mg via ORAL
  Filled 2020-02-05: qty 1

## 2020-02-05 SURGICAL SUPPLY — 71 items
APL PRP STRL LF DISP 70% ISPRP (MISCELLANEOUS) ×2
BEARING MENISCAL TIBIAL 4 SM L (Orthopedic Implant) ×3 IMPLANT
BIT DRILL QUICK REL 1/8 2PK SL (DRILL) ×1 IMPLANT
BNDG ELASTIC 6X5.8 VLCR STR LF (GAUZE/BANDAGES/DRESSINGS) ×3 IMPLANT
BRNG TIB SM 4 PHS 3 LT MEN (Orthopedic Implant) ×1 IMPLANT
CANISTER SUCT 1200ML W/VALVE (MISCELLANEOUS) ×3 IMPLANT
CANISTER SUCT 3000ML PPV (MISCELLANEOUS) ×3 IMPLANT
CEMENT BONE R 1X40 (Cement) ×3 IMPLANT
CEMENT VACUUM MIXING SYSTEM (MISCELLANEOUS) ×3 IMPLANT
CHLORAPREP W/TINT 26 (MISCELLANEOUS) ×6 IMPLANT
COMPONENT TIB MDL OXFRD LT SZA (Joint) ×1 IMPLANT
COOLER POLAR GLACIER W/PUMP (MISCELLANEOUS) ×3 IMPLANT
COVER MAYO STAND REUSABLE (DRAPES) ×3 IMPLANT
COVER WAND RF STERILE (DRAPES) ×3 IMPLANT
CUFF TOURN SGL QUICK 24 (TOURNIQUET CUFF)
CUFF TOURN SGL QUICK 30 (TOURNIQUET CUFF)
CUFF TOURN SGL QUICK 34 (TOURNIQUET CUFF)
CUFF TRNQT CYL 24X4X16.5-23 (TOURNIQUET CUFF) IMPLANT
CUFF TRNQT CYL 30X4X21-28X (TOURNIQUET CUFF) IMPLANT
CUFF TRNQT CYL 34X4.125X (TOURNIQUET CUFF) IMPLANT
DRAPE C-ARM XRAY 36X54 (DRAPES) IMPLANT
DRILL QUICK RELEASE 1/8 INCH (DRILL) ×2
DRSG MEPILEX SACRM 8.7X9.8 (GAUZE/BANDAGES/DRESSINGS) ×3 IMPLANT
DRSG OPSITE POSTOP 4X12 (GAUZE/BANDAGES/DRESSINGS) ×3 IMPLANT
DRSG OPSITE POSTOP 4X6 (GAUZE/BANDAGES/DRESSINGS) ×3 IMPLANT
ELECT CAUTERY BLADE 6.4 (BLADE) ×3 IMPLANT
ELECT REM PT RETURN 9FT ADLT (ELECTROSURGICAL) ×3
ELECTRODE REM PT RTRN 9FT ADLT (ELECTROSURGICAL) ×1 IMPLANT
GAUZE 4X4 16PLY RFD (DISPOSABLE) ×3 IMPLANT
GAUZE SPONGE 4X4 12PLY STRL (GAUZE/BANDAGES/DRESSINGS) ×3 IMPLANT
GAUZE XEROFORM 1X8 LF (GAUZE/BANDAGES/DRESSINGS) ×3 IMPLANT
GLOVE BIO SURGEON STRL SZ7.5 (GLOVE) ×12 IMPLANT
GLOVE BIO SURGEON STRL SZ8 (GLOVE) ×12 IMPLANT
GLOVE BIOGEL PI IND STRL 8 (GLOVE) ×1 IMPLANT
GLOVE BIOGEL PI INDICATOR 8 (GLOVE) ×2
GLOVE INDICATOR 8.0 STRL GRN (GLOVE) ×3 IMPLANT
GOWN STRL REUS W/ TWL LRG LVL3 (GOWN DISPOSABLE) ×1 IMPLANT
GOWN STRL REUS W/ TWL XL LVL3 (GOWN DISPOSABLE) ×1 IMPLANT
GOWN STRL REUS W/TWL LRG LVL3 (GOWN DISPOSABLE) ×3
GOWN STRL REUS W/TWL XL LVL3 (GOWN DISPOSABLE) ×3
HOOD PEEL AWAY FLYTE STAYCOOL (MISCELLANEOUS) ×12 IMPLANT
KIT TURNOVER KIT A (KITS) ×3 IMPLANT
MAT ABSORB  FLUID 56X50 GRAY (MISCELLANEOUS) ×2
MAT ABSORB FLUID 56X50 GRAY (MISCELLANEOUS) ×1 IMPLANT
NDL SAFETY ECLIPSE 18X1.5 (NEEDLE) ×1 IMPLANT
NEEDLE HYPO 18GX1.5 SHARP (NEEDLE) ×3
NEEDLE SPNL 20GX3.5 QUINCKE YW (NEEDLE) ×3 IMPLANT
NS IRRIG 1000ML POUR BTL (IV SOLUTION) ×3 IMPLANT
PACK BLADE SAW RECIP 70 3 PT (BLADE) ×3 IMPLANT
PACK TOTAL KNEE (MISCELLANEOUS) ×3 IMPLANT
PAD ABD DERMACEA PRESS 5X9 (GAUZE/BANDAGES/DRESSINGS) ×6 IMPLANT
PAD WRAPON POLAR KNEE (MISCELLANEOUS) ×1 IMPLANT
PEG FEMORAL PEGGED STRL SM (Knees) ×3 IMPLANT
PULSAVAC PLUS IRRIG FAN TIP (DISPOSABLE) ×3
SOL .9 NS 3000ML IRR  AL (IV SOLUTION) ×2
SOL .9 NS 3000ML IRR AL (IV SOLUTION) ×1
SOL .9 NS 3000ML IRR UROMATIC (IV SOLUTION) ×1 IMPLANT
STAPLER SKIN PROX 35W (STAPLE) ×3 IMPLANT
STRAP SAFETY 5IN WIDE (MISCELLANEOUS) ×3 IMPLANT
SUCTION FRAZIER HANDLE 10FR (MISCELLANEOUS) ×2
SUCTION TUBE FRAZIER 10FR DISP (MISCELLANEOUS) ×1 IMPLANT
SUT VIC AB 0 CT1 36 (SUTURE) ×3 IMPLANT
SUT VIC AB 2-0 CT1 27 (SUTURE) ×12
SUT VIC AB 2-0 CT1 TAPERPNT 27 (SUTURE) ×4 IMPLANT
SYR 10ML LL (SYRINGE) ×3 IMPLANT
SYR 20ML LL LF (SYRINGE) ×3 IMPLANT
SYR 30ML LL (SYRINGE) ×9 IMPLANT
TAPE TRANSPORE STRL 2 31045 (GAUZE/BANDAGES/DRESSINGS) ×3 IMPLANT
TIBIA MEDIAL OXFORD LEFT SZ A (Joint) ×3 IMPLANT
TIP FAN IRRIG PULSAVAC PLUS (DISPOSABLE) ×1 IMPLANT
WRAPON POLAR PAD KNEE (MISCELLANEOUS) ×3

## 2020-02-05 NOTE — H&P (Signed)
History of Present Illness:  Sonya Matthews is a 86 y.o. female who presents for history and physical for an upcoming left partial knee replacement to be done by Dr. Roland Rack on February 05, 2020. The patient has been treated for left knee degenerative joint disease and has had left knee pain secondary to advanced degenerative joint disease with a complex medial meniscus tear. The patient saw Dr. Roland Rack in August and was set up for surgery in October.. At that time, we have discussed a partial knee replacement, but the patient elected to hold off on surgery, given the Covid pandemic. The patient notes that her symptoms have gradually worsened since her last visit, especially over the past few months. She is having increased pain with any prolonged standing or ambulation, as well as with any twisting or turning activities, and has difficulty reciprocating stairs. She again localizes the pain to the medial aspect of her knee. She denies any reinjury to the knee, and denies any numbness or paresthesias down her leg to her foot. She has been taking Tylenol as necessary with limited benefit. She is quite frustrated by her persistent symptoms and functional limitations, and is now ready to consider more aggressive treatment options.  Current Outpatient Medications: . acetaminophen (TYLENOL) 500 MG tablet Take 1,000 mg by mouth 2 (two) times daily as needed for Pain  . amLODIPine (NORVASC) 10 MG tablet Take 1 tablet (10 mg total) by mouth once daily 90 tablet 3  . aspirin 81 MG EC tablet Take 81 mg by mouth once daily.  Marland Kitchen atorvastatin (LIPITOR) 20 MG tablet Take 1 tablet (20 mg total) by mouth once daily 90 tablet 3  . blood glucose meter (FREESTYLE LITE METER) kit Use as directed. 1 each 0  . cetirizine (ZYRTEC) 10 MG tablet Take 10 mg by mouth once daily.  . cholecalciferol (CHOLECALCIFEROL) 1000 unit tablet Take 1,000 Units by mouth once daily  . doxepin (SINEQUAN) 50 MG capsule Take 2 nightly as needed 180 capsule 3   . fluticasone propionate (FLONASE) 50 mcg/actuation nasal spray USE 2 SPRAYS IN EACH NOSTRIL ONCE DAILY 48 g 4  . folic acid/multivit-min/lutein (CENTRUM SILVER ORAL) Take 1 tablet by mouth once daily  . losartan (COZAAR) 100 MG tablet Take 1 tablet (100 mg total) by mouth once daily 90 tablet 3  . montelukast (SINGULAIR) 10 mg tablet Take 1 tablet (10 mg total) by mouth nightly 90 tablet 3  . spironolactone (ALDACTONE) 25 MG tablet Take 0.5 tablets (12.5 mg total) by mouth once daily 45 tablet 3  . triamcinolone 0.1 % cream Apply twice daily in a thin layer to the affected area(s) for up to 14 days. Do not use on the face. 454 g 1  . blood glucose diagnostic (GLUCOSE BLOOD) test strip Use once daily. 100 each 3  . ketotifen (ZADITOR) 0.025 % (0.035 %) ophthalmic solution Apply to eye  . lancets (ACCU-CHEK SOFTCLIX LANCETS) Use 1 each once daily Use as instructed. 100 each 3   Allergies:  . Adhesive Rash and Other (See Comments)  bandaids Tears skin off.  . Gabapentin Other (See Comments)  Altered alertness/felt stupid   Past Medical History:  . Arthritis  . Cataract cortical, senile 2006 &2014  both removed  . Chronic idiopathic urticaria 05/25/2014  Followed by Dermatology, considering Xolair  . Chronic pruritic rash in adult 10/28/2015  . Controlled type 2 diabetes mellitus without complication (CMS-HCC) 9/79/8921  . Dermal hypersensitivity reaction 07/23/2017  . Diabetes mellitus type 2,  uncomplicated (CMS-HCC)  . GERD (gastroesophageal reflux disease)  . HTN (hypertension) 09/04/2013  . Hypertension  . Mixed hyperlipidemia 09/04/2013  . Osteoarthritis   Past Surgical History:  Procedure Laterality Date  . APPENDECTOMY 1991  during hysterectomy  . BREAST EXCISIONAL BIOPSY Left  . CATARACT EXTRACTION Left  . CHOLECYSTECTOMY  . HYSTERECTOMY 1991  . KNEE ARTHROSCOPY Right  meniscus repair  . laparoscopic bladder repair 1997   Family History:  . Heart failure Mother  . Bone  cancer Mother  . Skin cancer Mother  . Breast cancer Mother  Heart failure/bone cancer  . Stroke Father  . High blood pressure (Hypertension) Father  . Hyperlipidemia (Elevated cholesterol) Father  Died age 94  . Coronary Artery Disease (Blocked arteries around heart) Father  heart failure  . Stroke Sister  . Hyperlipidemia (Elevated cholesterol) Sister  . Coronary Artery Disease (Blocked arteries around heart) Sister  heart failure  . Diabetes type II Sister  . High blood pressure (Hypertension) Sister  arthritis in her hands  . High blood pressure (Hypertension) Brother  . Stroke Brother  . Diabetes type II Brother  . Coronary Artery Disease (Blocked arteries around heart) Brother  . Colon cancer Maternal Grandmother  Colon cancer  . High blood pressure (Hypertension) Paternal Grandmother  . Coronary Artery Disease (Blocked arteries around heart) Paternal Grandfather  . Diabetes type II Paternal Grandfather  . High blood pressure (Hypertension) Sister  . Diabetes type II Sister  . Hyperlipidemia (Elevated cholesterol) Sister  Botrn 1938 diabetic  . Diabetes type II Brother  . High blood pressure (Hypertension) Brother  . Stroke Brother  . Coronary Artery Disease (Blocked arteries around heart) Brother  Heart failure  . Diabetes type II Sister  . Coronary Artery Disease (Blocked arteries around heart) Sister  . Diabetes type II Sister   Social History:   Socioeconomic History:  Marland Kitchen Marital status: Married  Spouse name: Not on file  . Number of children: Not on file  . Years of education: Not on file  . Highest education level: Not on file  Occupational History  . Not on file  Tobacco Use  . Smoking status: Never Smoker  . Smokeless tobacco: Never Used  Substance and Sexual Activity  . Alcohol use: Yes  Alcohol/week: 2.0 standard drinks  Types: 2 Glasses of wine per week  Comment: alcoholic drink approx once a month.  . Drug use: No  . Sexual activity: Not  Currently  Partners: Male  Other Topics Concern  . Would you please tell us about the people who live in your home, your pets, or anything else important to your social life? Yes  Comment: husband and adult daughter and her cat.  Social History Narrative  Married. Husband 66 yo diagnosed with bladder cancer and treated. Lung nodule diagnosed 08/2013.  She has never smoked but has extensive second hand smoke exposure.   Social Determinants of Health:   Emergency planning/management officer Strain:  . Difficulty of Paying Living Expenses:  Food Insecurity:  . Worried About Charity fundraiser in the Last Year:  . Arboriculturist in the Last Year:  Transportation Needs:  . Film/video editor (Medical):  Marland Kitchen Lack of Transportation (Non-Medical):   Review of Systems:  A comprehensive 14 point ROS was performed, reviewed, and the pertinent orthopaedic findings are documented in the HPI.  Physical Exam: Vitals:  01/28/20 1326  BP: 144/57  Pulse: 63  Weight: 66.7 kg (147 lb)  Height: 157.5 cm (  5' 2.01")  PainSc: 2  PainLoc: Knee   General/Constitutional: The patient appears to be well-nourished, well-developed, and in no acute distress. Neuro/Psych: Normal mood and affect, oriented to person, place and time. Eyes: Non-icteric. Pupils are equal, round, and reactive to light, and exhibit synchronous movement. ENT: Unremarkable. Lymphatic: No palpable adenopathy. Respiratory: No wheezes and Non-labored breathing Cardiovascular: No edema, swelling or tenderness, except as noted in detailed exam. Integumentary: No impressive skin lesions present, except as noted in detailed exam. Musculoskeletal: Unremarkable, except as noted in detailed exam.   Heart: Examination of the heart reveals regular, rate, and rhythm. There is no murmur noted on ascultation. There is a normal apical pulse.  Lungs: Lungs are clear to auscultation. There is no wheeze, rhonchi, or crackles. There is normal expansion of  bilateral chest walls.   Left knee exam: GAIT: Mild limp, favoring her left leg, but uses no assistive devices. ALIGNMENT: Normal SKIN: Unremarkable SWELLING: Miinimal EFFUSION: Trace WARMTH: None TENDERNESS: Moderately tender along medial joint line, but no lateral joint line tenderness ROM: 0-125 degrees with mild discomfort in maximal flexion greater than extension McMURRAY'S: Equivocally positive PATELLOFEMORAL: Normal tracking with no peri-patellar tenderness and negative apprehension sign CREPITUS: None LACHMAN'S: Negative PIVOT SHIFT: Negative ANTERIOR DRAWER: Negative POSTERIOR DRAWER: Negative VARUS/VALGUS: Mild pseudolaxity to varus stressing  She remains neurovascularly intact to the left lower extremity and foot.  X-rays/MRI/Lab data:  Standing AP and lateral x-rays of the left knee, as well as a sunrise view, are obtained. These films demonstrate moderate to severe degenerative changes, primarily involving the medial compartment, with near complete loss of the medial compartment clear space. The lateral patellofemoral compartments appear to be well preserved. No lytic lesions or fractures are identified. These findings may be slightly worse as compared to films from 2 years ago.  Assessment: . Primary osteoarthritis of left knee.  Plan: The treatment options were discussed with the patient and her daughter. In addition, patient educational materials were provided regarding the diagnosis and treatment options. The patient is quite frustrated by her symptoms and functional limitations, and is ready to consider more aggressive treatment options. Therefore, I have recommended a surgical procedure, specifically a left partial knee replacement. The procedure was discussed with the patient, as were the potential risks (including bleeding, infection, nerve and/or blood vessel injury, persistent or recurrent pain, loosening and/or failure of the components, dislocation, need for  further surgery, blood clots, strokes, heart attacks and/or arhythmias, pneumonia, etc.) and benefits. The patient states his/her understanding and wishes to proceed. All of the patient's questions and concerns were answered. She can call any time with further concerns. She will follow up post-surgery, routine.   H&P reviewed and patient re-examined. No changes.

## 2020-02-05 NOTE — OR Nursing (Signed)
Per Dr. Joice Lofts, via tele 1520, he will be in postop to see patient approx. 45 minutes (re PT evaluation)

## 2020-02-05 NOTE — Evaluation (Signed)
Physical Therapy Evaluation Patient Details Name: Sonya Matthews MRN: [id number] DOB: 04/05/1932 Today's Date: 02/05/2020   History of Present Illness  Pt is an 86 yo female s/p L partial knee replacement, WBAT. PMH of HTN, GERD, DM, heart murmur.  Clinical Impression  Patient alert, family at bedside, reported 4/10 in L knee, stated her L hip/low back was actually more painful. At baseline pt is independent with ADLs, lives with her husband and daughter, no falls to report.  The patient was able to perform several supine exercises with verbal and tactile cues, no physical assist needed. Supine to sit performed with HOB elevated and supervision. Once in sitting pt BP 115/50. The patient was able to ambulate ~153ft with RW and CGA total. Pt educated in step to gait pattern, able to progress to step through pattern. Several pt guided standing rest breaks due to lightheadedness/dizziness. Unable to ambulate further due to increase in light headedness, when BP assessed in sitting (~1 minute of pt sitting prior to BP assessment) reading 112/38. With time and re-checks improved to 107/45, standing re-attempted. After 3 minutes of standing, BP 98/52 and pt with complaints of dizziness throughout. RN notified and provided pt with fluids and juice upon request due to pt concerns of low blood sugar.  Due to pt symptoms and low BP, unable to attempt stair navigation at this time. Overall the patient demonstrated deficits (see "PT Problem List") that impede the patient's functional abilities, safety, and mobility and would benefit from skilled PT intervention. Recommendation is HHPT with supervision for mobility/OOB.      Follow Up Recommendations Home health PT;Supervision for mobility/OOB    Equipment Recommendations  Rolling walker with 5" wheels;3in1 (PT)    Recommendations for Other Services       Precautions / Restrictions Precautions Precautions: Fall;Knee Precaution Booklet Issued: Yes  (comment) Restrictions Weight Bearing Restrictions: Yes LLE Weight Bearing: Weight bearing as tolerated      Mobility  Bed Mobility Overal bed mobility: Needs Assistance Bed Mobility: Supine to Sit     Supine to sit: HOB elevated;Supervision          Transfers Overall transfer level: Needs assistance Equipment used: Rolling walker (2 wheeled) Transfers: Sit to/from Stand Sit to Stand: Min guard;Supervision         General transfer comment: cued for hand placement due to inexperience with RW use  Ambulation/Gait Ambulation/Gait assistance: Min guard Gait Distance (Feet): 115 Feet Assistive device: Rolling walker (2 wheeled)       General Gait Details: Pt educated in step to gait pattern, able to progress to step through pattern. Several pt guided standing rest breaks due to lightheadedness/dizziness. Unable to ambulate further due to increase in light headedness.  Stairs            Wheelchair Mobility    Modified Rankin (Stroke Patients Only)       Balance Overall balance assessment: Needs assistance Sitting-balance support: Feet supported Sitting balance-Leahy Scale: Good       Standing balance-Leahy Scale: Fair Standing balance comment: Pt able to stand without UE support but educated on improved safety to use at least unilateral UE support even with static standing                             Pertinent Vitals/Pain Pain Assessment: 0-10 Pain Score: 4  Pain Location: L knee Pain Descriptors / Indicators: Aching Pain Intervention(s): Limited activity within patient's tolerance;Monitored during  session;Repositioned;Premedicated before session;Ice applied    Home Living Family/patient expects to be discharged to:: Private residence Living Arrangements: Spouse/significant other;Children Available Help at Discharge: Family;Available 24 hours/day Type of Home: House Home Access: Stairs to enter Entrance Stairs-Rails: None (can use  doorframe when entering garage) Entrance Stairs-Number of Steps: 1 Home Layout: Multi-level;Able to live on main level with bedroom/bathroom Home Equipment: Grab bars - toilet;Grab bars - tub/shower      Prior Function Level of Independence: Independent         Comments: no falls to report     Hand Dominance   Dominant Hand: Right    Extremity/Trunk Assessment   Upper Extremity Assessment Upper Extremity Assessment: Overall WFL for tasks assessed    Lower Extremity Assessment Lower Extremity Assessment: RLE deficits/detail;LLE deficits/detail RLE Deficits / Details: WFLs LLE Deficits / Details: s/p partial knee replacement       Communication   Communication: No difficulties;HOH  Cognition Arousal/Alertness: Awake/alert Behavior During Therapy: WFL for tasks assessed/performed Overall Cognitive Status: Within Functional Limits for tasks assessed                                        General Comments      Exercises Total Joint Exercises Ankle Circles/Pumps: AROM;Both;10 reps Quad Sets: AROM;Both;10 reps Heel Slides: AROM;Strengthening;Left;10 reps Hip ABduction/ADduction: AROM;Strengthening;Left;10 reps   Assessment/Plan    PT Assessment Patient needs continued PT services  PT Problem List Decreased strength;Decreased mobility;Decreased range of motion;Decreased activity tolerance;Decreased knowledge of precautions;Decreased balance;Decreased knowledge of use of DME;Pain       PT Treatment Interventions DME instruction;Therapeutic exercise;Gait training;Balance training;Stair training;Neuromuscular re-education;Functional mobility training;Therapeutic activities;Patient/family education    PT Goals (Current goals can be found in the Care Plan section)  Acute Rehab PT Goals Patient Stated Goal: to go home PT Goal Formulation: With patient Time For Goal Achievement: 02/19/20 Potential to Achieve Goals: Good    Frequency BID   Barriers  to discharge        Co-evaluation               AM-PAC PT "6 Clicks" Mobility  Outcome Measure Help needed turning from your back to your side while in a flat bed without using bedrails?: None Help needed moving from lying on your back to sitting on the side of a flat bed without using bedrails?: None Help needed moving to and from a bed to a chair (including a wheelchair)?: A Little Help needed standing up from a chair using your arms (e.g., wheelchair or bedside chair)?: A Little Help needed to walk in hospital room?: A Little Help needed climbing 3-5 steps with a railing? : A Little 6 Click Score: 20    End of Session Equipment Utilized During Treatment: Gait belt Activity Tolerance: Patient tolerated treatment well;Treatment limited secondary to medical complications (Comment);Other (comment) (limited by low BP, pt reported lightheadedness/dizziness) Patient left: in chair;with nursing/sitter in room;with family/visitor present;with call bell/phone within reach Nurse Communication: Mobility status PT Visit Diagnosis: Other abnormalities of gait and mobility (R26.89);Muscle weakness (generalized) (M62.81);Pain;Difficulty in walking, not elsewhere classified (R26.2) Pain - Right/Left: Left Pain - part of body: Knee    Time: 1400-1507 PT Time Calculation (min) (ACUTE ONLY): 67 min   Charges:   PT Evaluation $PT Eval Low Complexity: 1 Low PT Treatments $Gait Training: 38-52 mins $Therapeutic Exercise: 8-22 mins       Lafonda Mosses  Orvan Falconer PT, DPT 3:33 PM,02/05/20

## 2020-02-05 NOTE — Anesthesia Procedure Notes (Signed)
Spinal  Patient location during procedure: pre-op Staffing Performed: anesthesiologist  Anesthesiologist: Corinda Gubler, MD Preanesthetic Checklist Completed: patient identified, IV checked, site marked, risks and benefits discussed, surgical consent, monitors and equipment checked, pre-op evaluation and timeout performed Spinal Block Patient position: sitting Prep: DuraPrep Patient monitoring: heart rate, cardiac monitor, continuous pulse ox and blood pressure Approach: midline Location: L3-4 Injection technique: single-shot Needle Needle type: Whitacre  Needle gauge: 22 G Needle length: 15 cm Needle insertion depth: 14 cm Assessment Sensory level: T6

## 2020-02-05 NOTE — Anesthesia Preprocedure Evaluation (Addendum)
Anesthesia Evaluation  Patient identified by MRN, date of birth, ID band Patient awake    Reviewed: Allergy & Precautions, NPO status , Patient's Chart, lab work & pertinent test results  History of Anesthesia Complications Negative for: history of anesthetic complications  Airway Mallampati: III  TM Distance: >3 FB Neck ROM: Full    Dental no notable dental hx. (+) Teeth Intact   Pulmonary neg pulmonary ROS, neg sleep apnea, neg COPD, Patient abstained from smoking.Not current smoker,    Pulmonary exam normal breath sounds clear to auscultation       Cardiovascular Exercise Tolerance: Good METShypertension, (-) CAD and (-) Past MI (-) dysrhythmias + Valvular Problems/Murmurs  Rhythm:Regular Rate:Normal - Systolic murmurs Stress test 2020 without evidence of ischemia  TTE 2020: NORMAL LEFT VENTRICULAR SYSTOLIC FUNCTION  NORMAL RIGHT VENTRICULAR SYSTOLIC FUNCTION  MODERATE VALVULAR REGURGITATION (See above)  NO VALVULAR STENOSIS  SCLEROTIC AORTIC VALVE  Morphology: MODERATELY THICKENED  Mobility: PARTIALLY MOBILE  Aortic: TRIVIAL AR  Mitral: MILD MR  Tricuspid: MODERATE TR  Size: MILDLY ENLARGED  Closest EF: >55% (Estimated)     Neuro/Psych negative neurological ROS  negative psych ROS   GI/Hepatic GERD  Controlled,(+)     (-) substance abuse  ,   Endo/Other  diabetes, Well Controlled  Renal/GU negative Renal ROS     Musculoskeletal  (+) Arthritis , Osteoarthritis,    Abdominal   Peds  Hematology   Anesthesia Other Findings Past Medical History: No date: Arthritis No date: Diabetes mellitus without complication (HCC)     Comment:  no meds No date: Dysrhythmia No date: GERD (gastroesophageal reflux disease)     Comment:  occasional No date: Heart murmur No date: Hypertension No date: Seasonal allergies  Reproductive/Obstetrics                            Anesthesia  Physical Anesthesia Plan  ASA: II  Anesthesia Plan: General/Spinal   Post-op Pain Management:    Induction: Intravenous  PONV Risk Score and Plan: 3 and Ondansetron, Dexamethasone, Propofol infusion and TIVA  Airway Management Planned: Natural Airway  Additional Equipment: None  Intra-op Plan:   Post-operative Plan:   Informed Consent: I have reviewed the patients History and Physical, chart, labs and discussed the procedure including the risks, benefits and alternatives for the proposed anesthesia with the patient or authorized representative who has indicated his/her understanding and acceptance.       Plan Discussed with: CRNA and Surgeon  Anesthesia Plan Comments: (Discussed R/B/A of neuraxial anesthesia technique with patient: - rare risks of spinal/epidural hematoma, nerve damage, infection - Risk of PDPH - Risk of nausea and vomiting - Risk of conversion to general anesthesia and its associated risks, including sore throat, damage to lips/teeth/oropharynx, and rare risks such as cardiac and respiratory events.  Patient voiced understanding.)        Anesthesia Quick Evaluation

## 2020-02-05 NOTE — Anesthesia Postprocedure Evaluation (Signed)
Anesthesia Post Note  Patient: Sonya Matthews  Procedure(s) Performed: UNICOMPARTMENTAL KNEE (Left Knee)  Patient location during evaluation: PACU Anesthesia Type: Combined General/Spinal Level of consciousness: oriented and awake and alert Pain management: pain level controlled Vital Signs Assessment: post-procedure vital signs reviewed and stable Respiratory status: spontaneous breathing, respiratory function stable and patient connected to nasal cannula oxygen Cardiovascular status: blood pressure returned to baseline and stable Postop Assessment: no headache, no backache and no apparent nausea or vomiting Anesthetic complications: no   No complications documented.   Last Vitals:  Vitals:   02/05/20 1119 02/05/20 1134  BP: (!) 116/42 (!) 117/45  Pulse: 64 68  Resp: 14 (!) 29  Temp:  36.4 C  SpO2: 94% 100%    Last Pain:  Vitals:   02/05/20 1104  TempSrc:   PainSc: 0-No pain                 Corinda Gubler

## 2020-02-05 NOTE — Progress Notes (Signed)
Physical Therapy Treatment Patient Details Name: Sonya Matthews MRN: [id number] DOB: November 04, 1931 Today's Date: 02/05/2020    History of Present Illness Pt is an 86 yo female s/p L partial knee replacement, WBAT. PMH of HTN, GERD, DM, heart murmur.    PT Comments    Pt alert, oriented, agreeable to re-attempt mobility. Sit <> stand several times during session with good carry over of hand placement for safety, RW and CGA provided. She was able to ambulate ~43ft with RW and CGA, cued for safe turning/pivots. BP assessed at initially standing, ~2 minutes and after bout of ambulation, low BP but WFLs and improved from previous session, pt without lightheadedness/dizziness. Pt also able to perform stair navigation with two rails 4 steps ascended/descended and CGA. Cued for technique, steadiness noted. The further questions or concerns at this time. The patient would benefit from further skilled PT intervention to continue to progress towards goals. Recommendation remains appropriate.      Follow Up Recommendations  Home health PT;Supervision for mobility/OOB     Equipment Recommendations  Rolling walker with 5" wheels;3in1 (PT)    Recommendations for Other Services       Precautions / Restrictions Precautions Precautions: Fall;Knee Precaution Booklet Issued: Yes (comment) Restrictions Weight Bearing Restrictions: Yes LLE Weight Bearing: Weight bearing as tolerated    Mobility  Bed Mobility Overal bed mobility: Needs Assistance Bed Mobility: Supine to Sit     Supine to sit: HOB elevated;Supervision     General bed mobility comments: deferred up in recliner  Transfers Overall transfer level: Needs assistance Equipment used: Rolling walker (2 wheeled) Transfers: Sit to/from Stand Sit to Stand: Supervision         General transfer comment: good follow through of hand placement  Ambulation/Gait Ambulation/Gait assistance: Min guard Gait Distance (Feet): 80 Feet Assistive  device: Rolling walker (2 wheeled)   Gait velocity: decreased   General Gait Details: step throught gait pattern without cueing. Verbal cues for turning/pivoting safely with RW   Stairs Stairs: Yes Stairs assistance: Min guard Stair Management: Two rails;Step to pattern Number of Stairs: 4 General stair comments: steady, safe, cueing for technique   Wheelchair Mobility    Modified Rankin (Stroke Patients Only)       Balance Overall balance assessment: Needs assistance Sitting-balance support: Feet supported Sitting balance-Leahy Scale: Good       Standing balance-Leahy Scale: Fair Standing balance comment: Pt able to stand without UE support but educated on improved safety to use at least unilateral UE support even with static standing                            Cognition Arousal/Alertness: Awake/alert Behavior During Therapy: WFL for tasks assessed/performed Overall Cognitive Status: Within Functional Limits for tasks assessed                                        Exercises Total Joint Exercises Ankle Circles/Pumps: AROM;Both;10 reps Quad Sets: AROM;Both;10 reps Heel Slides: AROM;Strengthening;Left;10 reps Hip ABduction/ADduction: AROM;Strengthening;Left;10 reps Other Exercises Other Exercises: BP assessed in standing initially, after 2 minutes and after bout of ambulation. Low BP ea time but WFLs and improved from previous session. Pt without complaints of light headedness/dizziness as well.    General Comments        Pertinent Vitals/Pain Pain Assessment: 0-10 Pain Score: 2  Pain Location:  L knee Pain Descriptors / Indicators: Aching Pain Intervention(s): Limited activity within patient's tolerance;Monitored during session;Premedicated before session;Repositioned;Ice applied    Home Living Family/patient expects to be discharged to:: Private residence Living Arrangements: Spouse/significant other;Children Available Help at  Discharge: Family;Available 24 hours/day Type of Home: House Home Access: Stairs to enter Entrance Stairs-Rails: None (can use doorframe when entering garage) Home Layout: Multi-level;Able to live on main level with bedroom/bathroom Home Equipment: Grab bars - toilet;Grab bars - tub/shower      Prior Function Level of Independence: Independent      Comments: no falls to report   PT Goals (current goals can now be found in the care plan section) Acute Rehab PT Goals Patient Stated Goal: to go home PT Goal Formulation: With patient Time For Goal Achievement: 02/19/20 Potential to Achieve Goals: Good Progress towards PT goals: Progressing toward goals    Frequency    BID      PT Plan Current plan remains appropriate    Co-evaluation              AM-PAC PT "6 Clicks" Mobility   Outcome Measure  Help needed turning from your back to your side while in a flat bed without using bedrails?: None Help needed moving from lying on your back to sitting on the side of a flat bed without using bedrails?: None Help needed moving to and from a bed to a chair (including a wheelchair)?: A Little Help needed standing up from a chair using your arms (e.g., wheelchair or bedside chair)?: A Little Help needed to walk in hospital room?: A Little Help needed climbing 3-5 steps with a railing? : A Little 6 Click Score: 20    End of Session Equipment Utilized During Treatment: Gait belt Activity Tolerance: Patient tolerated treatment well Patient left: in chair;with nursing/sitter in room;with family/visitor present;with call bell/phone within reach Nurse Communication: Mobility status PT Visit Diagnosis: Other abnormalities of gait and mobility (R26.89);Muscle weakness (generalized) (M62.81);Pain;Difficulty in walking, not elsewhere classified (R26.2) Pain - Right/Left: Left Pain - part of body: Knee     Time: 0160-1093 PT Time Calculation (min) (ACUTE ONLY): 25 min  Charges:   $Gait Training: 8-22 mins $Therapeutic Exercise: 8-22 mins                     Olga Coaster PT, DPT 4:35 PM,02/05/20

## 2020-02-05 NOTE — Anesthesia Procedure Notes (Signed)
Spinal  Patient location during procedure: OR Start time: 02/05/2020 8:20 AM End time: 02/05/2020 8:33 AM Staffing Performed: anesthesiologist  Anesthesiologist: Arita Miss, MD Resident/CRNA: Gaynelle Cage, CRNA Preanesthetic Checklist Completed: patient identified, IV checked, site marked, risks and benefits discussed, surgical consent, monitors and equipment checked, pre-op evaluation and timeout performed Spinal Block Patient position: sitting Prep: ChloraPrep Patient monitoring: heart rate, continuous pulse ox, blood pressure and cardiac monitor Approach: midline Location: L3-4 Injection technique: single-shot Needle Needle type: Quincke  Needle gauge: 22 G Needle length: 9 cm Assessment Sensory level: T10 Additional Notes Negative paresthesia. Negative blood return. Positive free-flowing CSF. Expiration date of kit checked and confirmed. Patient tolerated procedure well, without complications.

## 2020-02-05 NOTE — Op Note (Signed)
02/05/2020  10:23 AM  Patient:   Sonya Matthews  Pre-Op Diagnosis:   Osteoarthritis of medial compartment, left knee.  Post-Op Diagnosis:   Same  Procedure:   Left unicondylar knee arthroplasty.  Surgeon:   Maryagnes Amos, MD  Assistant:   Horris Latino, PA-C; Orvan July, PA-S  Anesthesia:   Spinal  Findings:   As above.  Complications:   None  EBL:   5 cc  Fluids:   1000 cc crystalloid  UOP:   None  TT:   80 minutes at 300 mmHg  Drains:   None  Closure:   Staples  Implants:   All-cemented Biomet Oxford system with a Small femoral component, an "A" sized tibial tray, and a 4 mm meniscal bearing insert.  Brief Clinical Note:   The patient is a 86 year old female with a long history of gradually worsening medial-sided left knee pain. Her symptoms have progressed despite medications, activity modification, etc. Her history and examination are consistent with advanced degenerative joint disease, primarily limited to the medial compartment, as confirmed by MRI scan. The patient presents at this time for a left partial knee replacement.  Procedure:   The patient was brought into the operating room and a spinal placed by the anesthesiologist. The patient was lain in the supine position, then repositioned so that the non-surgical leg was placed in a flexed and abducted position in the yellow fin leg holder while the surgical extremity was placed over the Biomet leg holder.  A timeout was performed to verify the appropriate surgical site before the left lower extremity was prepped with ChloraPrep solution and the draped sterilely. Preoperative antibiotics were administered. After performing a timeout to verify the appropriate surgical site, the limb was exsanguinated with an Esmarch and the tourniquet inflated to 300 mmHg.   A standard anterior approach to the knee was made through an approximately 3.5-4 inch incision. The incision was carried down through the subcutaneous tissues to  expose the superficial retinaculum. This was split the length the incision and the medial flap elevated sufficiently to expose the medial retinaculum. The medial retinaculum was incised along the medial border of the patella tendon and extended proximally along the medial border of the patella, leaving a 3-4 mm cuff of tissue. The soft tissues were elevated off the anteromedial aspect of the proximal tibia. The anterior portion of the meniscus was removed after performing a subtotal excision of the infrapatellar fat pad. The anterior cruciate ligament was inspected and found to be in excellent condition. Osteophytes were removed from the inferior pole of the patella as well as from the notch using a quarter-inch osteotome. There were significant degenerative changes of both the femur and tibia on the medial side. The medial femoral condyle was sized using the small and extra small sizers. It was felt that the small guide best optimized the contour of the femur. This was left in place and the external tibial guide positioned. The coupling device was used to connect the guide to the medial femoral condylar sizer to optimize appropriate orientation. Two guide pins were inserted into the cutting block before the coupling device and sizer were removed. The appropriate tibial cut was made using the oscillating and reciprocating saws. The piece was removed in its entirety and taken to the back table where it was sized and found to be optimally replicated by an "A" sized component. The 8 mm spacer was inserted to verify that sufficient bone had been removed.  Attention was  directed to femoral side. The intramedullary canal was accessed through a 4 mm drill hole. The intramedullary guide was positioned before the guide for the femoral condylar holes was positioned. The appropriate coupling device connected this guide to the intramedullary guide before both drill holes were created in the distal aspect of the medial femoral  condyle. The devices were removed and the posterior condylar cutting block inserted. The appropriate cut was made using the reciprocating saw and this piece removed. The #0 spigot was inserted and the initial bone milling performed. A trial femoral component was inserted and both the flexion and extension gaps measured. In flexion, the gap measured 7 mm whereas in extension, it measured 4 mm. Therefore, the #3 spigot was selected and the secondary bone milling performed. Repeat sizing demonstrated symmetric flexion and extension gaps. The bone was removed from the postero-medial and postero-lateral aspects of the femoral condyle, as well as from the beneath the collar of the spigot. Bone also was removed from the anterior portion of the femur so as to minimize any potential impingement with the meniscal bearing insert. The trial components removed and several drill holes placed into the distal femoral condyle to further augment cement fixation.  Attention was redirected to the tibial side. The "A" sized tibial tray was positioned and temporarily secured using the appropriate spiked nail. The keel was created using the bi-bladed reciprocating saw and hoe. The keeled "A" sized trial tibial tray was inserted to be sure that it seated properly. At this point, a total of 20 cc of Exparel diluted out to 60 cc with normal saline and 30 cc of 0.5% Sensorcaine was injected in and around the posterior and medial capsular tissues, as well as the peri-incisional tissues to help with postoperative pain control.  The bony surfaces were prepared for cementing by irrigating them thoroughly with bacitracin saline solution using the jet lavage system before packing them with a dry Ray-Tec sponge. Meanwhile, cement was being mixed on the back table. When the cement was ready, the tibial tray was cemented in first. The excess cement was removed using a Public house manager after impacting it into place. Next, the femoral component was  impacted into place. Again the excess cement was removed using a Public house manager. The 4 mm spacer was inserted and the knee brought into near full extension while the cement hardened. Once the cement hardened, the spacer was removed and the 4 mm meniscal bearing insert was trialed. This demonstrated excellent tracking while the knee was placed through a range of motion, and showed no evidence towards subluxation or dislocation. In addition, it did not fit too tightly. Therefore, the permanent 4 mm meniscal bearing insert was snapped into position after verifying that no cement had been retained posteriorly. Again the knee was placed through a range of motion with the findings as described above.  The wound was copiously irrigated with sterile saline solution via the jet lavage system before the retinacular layer was reapproximated using #0 Vicryl interrupted sutures. At this point, 1 g of transexemic acid in 10 cc of normal saline was injected intra-articularly. The subcutaneous tissues were closed in two layers using 2-0 Vicryl interrupted sutures before the skin was closed using staples. A sterile occlusive dressing was applied to the knee before the patient was awakened. The patient was transferred back to his/her hospital bed and returned to the recovery room in satisfactory condition after tolerating the procedure well. A Polar Care device was applied to the knee as well.

## 2020-02-05 NOTE — OR Nursing (Signed)
OK to d/c pt to home per Dr. Joice Lofts (after PT back in for 2nd time).  Home with rolling walker/BSC/polar care.

## 2020-02-05 NOTE — OR Nursing (Signed)
PT in for evaluation @ 1410.

## 2020-02-05 NOTE — OR Nursing (Addendum)
Patient advises she does not have BSC or rolling walker at home; both ordered by MD.  Discussed with CSW Teresita via tele 1205 pm, states she will have sent up to postop.

## 2020-02-05 NOTE — Discharge Instructions (Addendum)
Orthopedic discharge instructions: May shower with intact op-site dressing. Apply ice frequently to knee or use Polar Care device. Take Eliquis 2.5 mg BID for 2 weeks, then take aspirin 325 mg daily for 4 weeks, then resume daily baby aspirin.    BID = twice daily (every 12 hours) Take pain medication as prescribed or ES Tylenol when needed.  May weight-bear as tolerated on left leg - use walker as needed for balance and support. Follow-up in 10-14 days or as scheduled.   Bupivacaine Liposomal Suspension for Injection What is this medicine? BUPIVACAINE LIPOSOMAL (bue PIV a kane LIP oh som al) is an anesthetic. It causes loss of feeling in the skin or other tissues. It is used to prevent and to treat pain from some procedures. This medicine may be used for other purposes; ask your health care provider or pharmacist if you have questions. COMMON BRAND NAME(S): EXPAREL What should I tell my health care provider before I take this medicine? They need to know if you have any of these conditions:  G6PD deficiency  heart disease  kidney disease  liver disease  low blood pressure  lung or breathing disease, like asthma  an unusual or allergic reaction to bupivacaine, other medicines, foods, dyes, or preservatives  pregnant or trying to get pregnant  breast-feeding How should I use this medicine? This medicine is for injection into the affected area. It is given by a health care professional in a hospital or clinic setting. Talk to your pediatrician regarding the use of this medicine in children. Special care may be needed. Overdosage: If you think you have taken too much of this medicine contact a poison control center or emergency room at once. NOTE: This medicine is only for you. Do not share this medicine with others. What if I miss a dose? This does not apply. What may interact with this medicine? This medicine may interact with the following  medications:  acetaminophen  certain antibiotics like dapsone, nitrofurantoin, aminosalicylic acid, sulfonamides  certain medicines for seizures like phenobarbital, phenytoin, valproic acid  chloroquine  cyclophosphamide  flutamide  hydroxyurea  ifosfamide  metoclopramide  nitric oxide  nitroglycerin  nitroprusside  nitrous oxide  other local anesthetics like lidocaine, pramoxine, tetracaine  primaquine  quinine  rasburicase  sulfasalazine This list may not describe all possible interactions. Give your health care provider a list of all the medicines, herbs, non-prescription drugs, or dietary supplements you use. Also tell them if you smoke, drink alcohol, or use illegal drugs. Some items may interact with your medicine. What should I watch for while using this medicine? Your condition will be monitored carefully while you are receiving this medicine. Be careful to avoid injury while the area is numb, and you are not aware of pain. What side effects may I notice from receiving this medicine? Side effects that you should report to your doctor or health care professional as soon as possible:  allergic reactions like skin rash, itching or hives, swelling of the face, lips, or tongue  seizures  signs and symptoms of a dangerous change in heartbeat or heart rhythm like chest pain; dizziness; fast, irregular heartbeat; palpitations; feeling faint or lightheaded; falls; breathing problems  signs and symptoms of methemoglobinemia such as pale, gray, or blue colored skin; headache; fast heartbeat; shortness of breath; feeling faint or lightheaded, falls; tiredness Side effects that usually do not require medical attention (report to your doctor or health care professional if they continue or are bothersome):  anxious  back pain  changes in taste  changes in vision  constipation  dizziness  fever  nausea, vomiting This list may not describe all possible side  effects. Call your doctor for medical advice about side effects. You may report side effects to FDA at 1-800-FDA-1088. Where should I keep my medicine? This drug is given in a hospital or clinic and will not be stored at home. NOTE: This sheet is a summary. It may not cover all possible information. If you have questions about this medicine, talk to your doctor, pharmacist, or health care provider.  2020 Elsevier/Gold Standard (2019-01-14 10:48:23)    AMBULATORY SURGERY  DISCHARGE INSTRUCTIONS   1) The drugs that you were given will stay in your system until tomorrow so for the next 24 hours you should not:  A) Drive an automobile B) Make any legal decisions C) Drink any alcoholic beverage   2) You may resume regular meals tomorrow.  Today it is better to start with liquids and gradually work up to solid foods.  You may eat anything you prefer, but it is better to start with liquids, then soup and crackers, and gradually work up to solid foods.   3) Please notify your doctor immediately if you have any unusual bleeding, trouble breathing, redness and pain at the surgery site, drainage, fever, or pain not relieved by medication.    4) Additional Instructions:        Please contact your physician with any problems or Same Day Surgery at [phone], Monday through Friday 6 am to 4 pm, or [place] at [facility] number at [phone].

## 2020-02-05 NOTE — OR Nursing (Signed)
PT back in to see pt @ 1605.

## 2020-02-05 NOTE — Transfer of Care (Signed)
Immediate Anesthesia Transfer of Care Note  Patient: Sonya Matthews  Procedure(s) Performed: UNICOMPARTMENTAL KNEE (Left Knee)  Patient Location: PACU  Anesthesia Type:Spinal  Level of Consciousness: drowsy  Airway & Oxygen Therapy: Patient Spontanous Breathing and Patient connected to nasal cannula oxygen  Post-op Assessment: Report given to RN and Post -op Vital signs reviewed and stable  Post vital signs: Reviewed and stable  Last Vitals:  Vitals Value Taken Time  BP 113/41 02/05/20 1034  Temp    Pulse 64 02/05/20 1036  Resp 17 02/05/20 1036  SpO2 100 % 02/05/20 1036  Vitals shown include unvalidated device data.  Last Pain:  Vitals:   02/05/20 1034  TempSrc:   PainSc: (P) Asleep         Complications: No complications documented.

## 2020-02-06 ENCOUNTER — Encounter: Payer: Self-pay | Admitting: Surgery

## 2020-03-09 ENCOUNTER — Emergency Department
Admission: EM | Admit: 2020-03-09 | Discharge: 2020-03-09 | Disposition: A | Discharge status: Home / Self Care | Payer: Medicare Other | Attending: Emergency Medicine | Admitting: Emergency Medicine

## 2020-03-09 ENCOUNTER — Other Ambulatory Visit: Payer: Self-pay

## 2020-03-09 DIAGNOSIS — Z79899 Other long term (current) drug therapy: Secondary | ICD-10-CM | POA: Diagnosis not present

## 2020-03-09 DIAGNOSIS — R42 Dizziness and giddiness: Secondary | ICD-10-CM | POA: Diagnosis present

## 2020-03-09 DIAGNOSIS — E119 Type 2 diabetes mellitus without complications: Secondary | ICD-10-CM | POA: Insufficient documentation

## 2020-03-09 DIAGNOSIS — Z96652 Presence of left artificial knee joint: Secondary | ICD-10-CM | POA: Insufficient documentation

## 2020-03-09 DIAGNOSIS — I10 Essential (primary) hypertension: Secondary | ICD-10-CM | POA: Diagnosis not present

## 2020-03-09 DIAGNOSIS — Z7901 Long term (current) use of anticoagulants: Secondary | ICD-10-CM | POA: Insufficient documentation

## 2020-03-09 DIAGNOSIS — E860 Dehydration: Secondary | ICD-10-CM | POA: Diagnosis not present

## 2020-03-09 DIAGNOSIS — I951 Orthostatic hypotension: Secondary | ICD-10-CM | POA: Diagnosis not present

## 2020-03-09 LAB — BASIC METABOLIC PANEL
Anion gap: 12 (ref 5–15)
BUN: 31 mg/dL — ABNORMAL HIGH (ref 8–23)
CO2: 23 mmol/L (ref 22–32)
Calcium: 9.6 mg/dL (ref 8.9–10.3)
Chloride: 99 mmol/L (ref 98–111)
Creatinine, Ser: 1.05 mg/dL — ABNORMAL HIGH (ref 0.44–1.00)
GFR, Estimated: 52 mL/min — ABNORMAL LOW (ref >60)
Glucose, Bld: 133 mg/dL — ABNORMAL HIGH (ref 70–99)
Potassium: 4.5 mmol/L (ref 3.5–5.1)
Sodium: 134 mmol/L — ABNORMAL LOW (ref 135–145)

## 2020-03-09 LAB — CBC
HCT: 36.2 % (ref 36.0–46.0)
Hemoglobin: 12.2 g/dL (ref 12.0–15.0)
MCH: 31.6 pg (ref 26.0–34.0)
MCHC: 33.7 g/dL (ref 30.0–36.0)
MCV: 93.8 fL (ref 80.0–100.0)
Platelets: 359 10*3/uL (ref 150–400)
RBC: 3.86 MIL/uL — ABNORMAL LOW (ref 3.87–5.11)
RDW: 13.2 % (ref 11.5–15.5)
WBC: 8.5 10*3/uL (ref 4.0–10.5)
nRBC: 0 % (ref 0.0–0.2)

## 2020-03-09 LAB — URINALYSIS, COMPLETE (UACMP) WITH MICROSCOPIC
Bilirubin Urine: NEGATIVE
Glucose, UA: NEGATIVE mg/dL
Hgb urine dipstick: NEGATIVE
Ketones, ur: NEGATIVE mg/dL
Leukocytes,Ua: NEGATIVE
Nitrite: NEGATIVE
Protein, ur: NEGATIVE mg/dL
Specific Gravity, Urine: 1.016 (ref 1.005–1.030)
pH: 5 (ref 5.0–8.0)

## 2020-03-09 MED ORDER — SODIUM CHLORIDE 0.9 % IV BOLUS
500.0000 mL | Freq: Once | INTRAVENOUS | Status: AC
  Administered 2020-03-09: 500 mL via INTRAVENOUS

## 2020-03-09 NOTE — Discharge Instructions (Signed)
For now, stop taking the spironolactone.  Measure your blood pressure twice daily for the next several days.  If your blood pressure remains elevated, greater than 160s over 95, you can start taking the half tablet again.  Call your primary to discuss your ER visit and likely need for echocardiogram.  Your pulse pressure was very wide today with a heart murmur noted on exam, which could be a sign of an aortic valve issue.  Make sure that you call your doctor tomorrow to discuss your ER visit and to possibly arrange for an echocardiogram for your visit in early December.  Drink at least 6 to 8 glasses of water.  Be very careful when going from sitting to standing.

## 2020-03-09 NOTE — ED Provider Notes (Signed)
[facility] Emergency Department Provider Note  ____________________________________________   First MD Initiated Contact with Patient 03/09/20 1201     (approximate)  I have reviewed the triage vital signs and the nursing notes.   HISTORY  Chief Complaint Dizziness    HPI Sonya Matthews is a 86 y.o. female with past medical history as below here with mild lightheadedness upon standing.  The patient states that she recently underwent surgery for her left knee.  She states that has been recovering very well.  The swelling and pain is all improved.  She has been on antibiotic for recent infection as well, and states that her redness and pain has not been improving.  She went to her orthopedist today for routine checkup and was reportedly dizzy when she stood up to get out of the car.  She was reportedly hypotensive with a pressure of 120 over 30s there so she was sent here for evaluation.  She does note that she has been somewhat lightheaded upon standing for the last several days.  She admits to slightly decreased appetite since the surgery.  She also has been taking spironolactone, which she had been previously told to stop prior to the surgery.  Denies any abdominal pain.  No nausea or vomiting.  No other complaints.  No chest pain.  No shortness of breath.        Past Medical History:  Diagnosis Date  . Arthritis   . Diabetes mellitus without complication (HCC)    no meds  . Dysrhythmia   . GERD (gastroesophageal reflux disease)    occasional  . Heart murmur   . Hypertension   . Seasonal allergies     There are no problems to display for this patient.   Past Surgical History:  Procedure Laterality Date  . ABDOMINAL HYSTERECTOMY    . APPENDECTOMY    . BLADDER SUSPENSION    . BREAST BIOPSY Left 2003   neg  . BREAST BIOPSY Left 1991   neg-needle bx  . CHOLECYSTECTOMY    . EYE SURGERY Bilateral    cataract  . PARTIAL KNEE ARTHROPLASTY Left  02/05/2020   Procedure: UNICOMPARTMENTAL KNEE;  Surgeon: Christena FlakePoggi, John J, MD;  Location: [facility] ORS;  Service: Orthopedics;  Laterality: Left;    Prior to Admission medications   Medication Sig Start Date End Date Taking? Authorizing Provider  acetaminophen (TYLENOL) 500 MG tablet Take 1,000 mg by mouth every 6 (six) hours as needed for moderate pain.    [provider]  amLODipine (NORVASC) 10 MG tablet Take 10 mg by mouth daily.    [provider]  apixaban (ELIQUIS) 2.5 MG TABS tablet Take 1 tablet (2.5 mg total) by mouth 2 (two) times daily. 02/05/20   Poggi, Excell SeltzerJohn J, MD  atorvastatin (LIPITOR) 20 MG tablet Take 20 mg by mouth daily.     [provider]  cetirizine (ZYRTEC) 10 MG tablet Take 10 mg by mouth daily.    [provider]  cholecalciferol (VITAMIN D3) 25 MCG (1000 UNIT) tablet Take 1,000 Units by mouth daily.    [provider]  doxepin (SINEQUAN) 50 MG capsule Take 50 mg by mouth at bedtime as needed. 1-2 as needed for sleep    [provider]  fluticasone (FLONASE) 50 MCG/ACT nasal spray Place into both nostrils daily.     [provider]  ketotifen (ZADITOR) 0.025 % ophthalmic solution Place 1 drop into both eyes daily as needed (allergies).  [provider]  losartan (COZAAR) 100 MG tablet Take 100 mg by mouth daily. am    [provider]  montelukast (SINGULAIR) 10 MG tablet Take 10 mg by mouth at bedtime.    [provider]  Multiple Vitamin (MULTIVITAMIN WITH MINERALS) TABS tablet Take 1 tablet by mouth daily.    [provider]  oxyCODONE (ROXICODONE) 5 MG immediate release tablet Take 1-2 tablets (5-10 mg total) by mouth every 4 (four) hours as needed for moderate pain. 02/05/20   Poggi, Excell Seltzer, MD  triamcinolone cream (KENALOG) 0.1 % Apply 1 application topically 2 (two) times daily as needed for rash. 09/18/19   [provider]  spironolactone (ALDACTONE) 25 MG tablet  Take 12.5 mg by mouth daily.   03/09/20  [provider]    Allergies Thimerosal  Family History  Problem Relation Age of Onset  . Breast cancer Mother 77  . Breast cancer Maternal Aunt     Social History Social History   Tobacco Use  . Smoking status: Never Smoker  . Smokeless tobacco: Never Used  Vaping Use  . Vaping Use: Never used  Substance Use Topics  . Alcohol use: Yes    Comment: rare  . Drug use: No    Review of Systems  Review of Systems  Constitutional: Positive for fatigue. Negative for fever.  HENT: Negative for congestion and sore throat.   Eyes: Negative for visual disturbance.  Respiratory: Negative for cough and shortness of breath.   Cardiovascular: Negative for chest pain.  Gastrointestinal: Negative for abdominal pain, diarrhea, nausea and vomiting.  Genitourinary: Negative for flank pain.  Musculoskeletal: Negative for back pain and neck pain.  Skin: Negative for rash and wound.  Neurological: Positive for light-headedness. Negative for weakness.     ____________________________________________  PHYSICAL EXAM:      VITAL SIGNS: ED Triage Vitals  Enc Vitals Group     BP 03/09/20 1109 134/64     Pulse Rate 03/09/20 1109 79     Resp 03/09/20 1109 18     Temp 03/09/20 1109 97.7 F (36.5 C)     Temp Source 03/09/20 1109 Oral     SpO2 03/09/20 1109 100 %     Weight 03/09/20 1110 140 lb (63.5 kg)     Height 03/09/20 1110 5\' 2"  (1.575 m)     Head Circumference --      Peak Flow --      Pain Score 03/09/20 1109 0     Pain Loc --      Pain Edu? --      Excl. in GC? --      Physical Exam Vitals and nursing note reviewed.  Constitutional:      General: She is not in acute distress.    Appearance: She is well-developed.  HENT:     Head: Normocephalic and atraumatic.  Eyes:     Conjunctiva/sclera: Conjunctivae normal.  Cardiovascular:     Rate and Rhythm: Normal rate and regular rhythm.     Heart sounds: Murmur heard.   Systolic murmur is present with a grade of 2/6.  No friction rub.  Pulmonary:     Effort: Pulmonary effort is normal. No respiratory distress.     Breath sounds: Normal breath sounds. No wheezing or rales.  Abdominal:     General: There is no distension.     Palpations: Abdomen is soft.     Tenderness: There is no abdominal tenderness.  Musculoskeletal:  Cervical back: Neck supple.  Skin:    General: Skin is warm.     Capillary Refill: Capillary refill takes less than 2 seconds.  Neurological:     Mental Status: She is alert and oriented to person, place, and time.     Motor: No abnormal muscle tone.       ____________________________________________   LABS (all labs ordered are listed, but only abnormal results are displayed)  Labs Reviewed  BASIC METABOLIC PANEL - Abnormal; Notable for the following components:      Result Value   Sodium 134 (*)    Glucose, Bld 133 (*)    BUN 31 (*)    Creatinine, Ser 1.05 (*)    GFR, Estimated 52 (*)    All other components within normal limits  CBC - Abnormal; Notable for the following components:   RBC 3.86 (*)    All other components within normal limits  URINALYSIS, COMPLETE (UACMP) WITH MICROSCOPIC - Abnormal; Notable for the following components:   Color, Urine YELLOW (*)    APPearance HAZY (*)    Bacteria, UA RARE (*)    All other components within normal limits    ____________________________________________  EKG: Normal sinus rhythm, VR 77. PR 172, QRS 62, QTc 368. No acute ST elevation or depression.  ________________________________________  RADIOLOGY All imaging, including plain films, CT scans, and ultrasounds, independently reviewed by me, and interpretations confirmed via formal radiology reads.  ED MD interpretation:     Official radiology report(s): No results found.  ____________________________________________  PROCEDURES   Procedure(s) performed (including Critical  Care):  Procedures  ____________________________________________  INITIAL IMPRESSION / MDM / ASSESSMENT AND PLAN / ED COURSE  As part of my medical decision making, I reviewed the following data within the electronic MEDICAL RECORD NUMBER Nursing notes reviewed and incorporated, Old chart reviewed, Notes from prior ED visits, and [place] Controlled Substance Database       *CHAIA IKARD was evaluated in Emergency Department on 03/09/2020 for the symptoms described in the history of present illness. She was evaluated in the context of the global COVID-19 pandemic, which necessitated consideration that the patient might be at risk for infection with the SARS-CoV-2 virus that causes COVID-19. Institutional protocols and algorithms that pertain to the evaluation of patients at risk for COVID-19 are in a state of rapid change based on information released by regulatory bodies including the CDC and federal and state organizations. These policies and algorithms were followed during the patient's care in the ED.  Some ED evaluations and interventions may be delayed as a result of limited staffing during the pandemic.*     Medical Decision Making:  86 yo F here with lightheadedness upon standing. Suspect orthostasis in setting of poor pO intake from recent surgery, as well as continued use of spironolactone despite PCP advising against. Pt is in NAD and well appearing here. She is orthostatic but otherwise nontoxic. EKG shows NSR and tele reveals no arrhythmia or ectopy. No CP or signs of ACS, PE. UA without UTI. Lytes, CBC unremarkable. Pt feels better with IVF. She does have a systolic murmur so while there could be a component of AI/AR given her wide pulse pressure, feel this can be safely managed as an outpt. She has a PCP appt in the next 1-2 weeks. Will advise her to hydrate, hold her spironolactone, and call her PCP re: TTE and further eval.  ____________________________________________  FINAL CLINICAL  IMPRESSION(S) / ED DIAGNOSES  Final diagnoses:  Dehydration  Orthostasis     MEDICATIONS GIVEN DURING THIS VISIT:  Medications  sodium chloride 0.9 % bolus 500 mL (0 mLs Intravenous Stopped 03/09/20 1357)     ED Discharge Orders    None       Note:  This document was prepared using Dragon voice recognition software and may include unintentional dictation errors.   Shaune Pollack, MD 03/09/20 [phone]

## 2020-03-09 NOTE — ED Triage Notes (Signed)
Pt awoke this AM c/o dizziness with standing. Denies dizziness currently. Went to follow-up appt with her doctors after knee surgery and had 2 reading of low blood pressure, sent to ED for evaluation.

## 2020-03-09 NOTE — ED Notes (Signed)
Orthostatics: Laying: 143/54 HR 74 Sitting: 146/46 HR 78 Standing: 117/52 HR 93  Pt with dizziness with sitting to standing.  Provider aware.

## 2020-03-09 NOTE — ED Notes (Signed)
Pt ambulated to restroom without difficulty. Pt states she felt less dizzy this time.

## 2020-04-14 IMAGING — MR MR LUMBAR SPINE W/O CM
4 of 5 series · 25 of 48 positions shown · non-contrast
Comparison: None.

CLINICAL DATA: Low back and right hip and leg pain for 3 months. No
known injury.

EXAM:
MRI LUMBAR SPINE WITHOUT CONTRAST
TECHNIQUE: Multiplanar, multisequence MR imaging of the lumbar spine was
performed. No intravenous contrast was administered.

[Series 2: T2 · sagittal · 4.0mm · 0.81mm/px · 6 of 15 slices shown (1 of 2)]
[im 1/15]
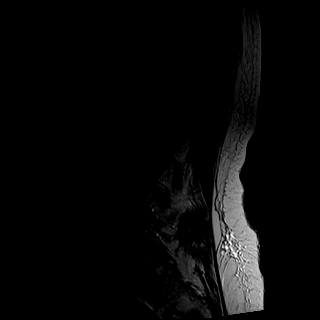
[im 3/15]
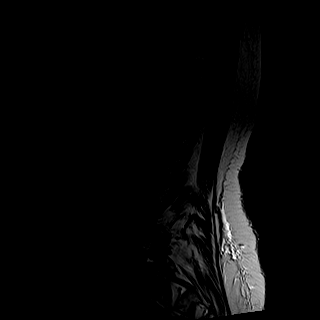
[im 6/15]
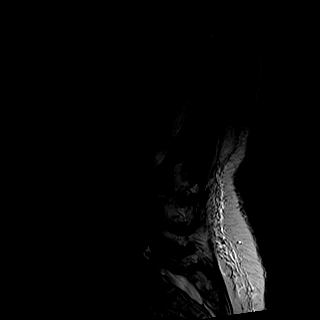
[im 9/15]
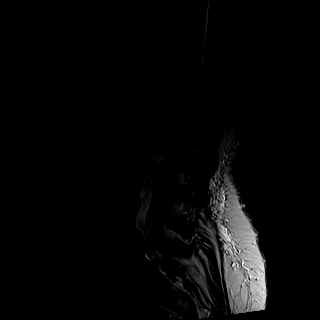
[im 12/15]
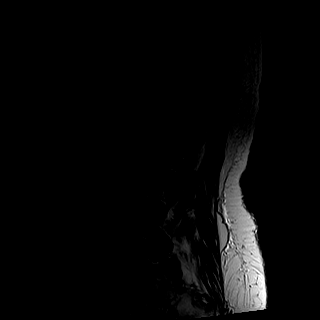
[im 15/15]
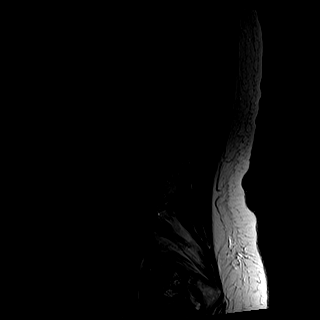

[Series 3: T1 · sagittal · 4.0mm · 0.41mm/px · 6 of 15 slices shown (1 of 2)]
[im 1/15]
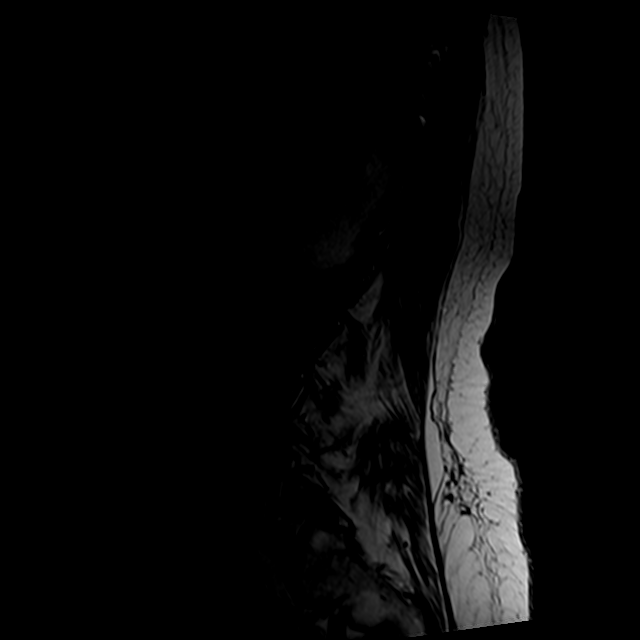
[im 3/15]
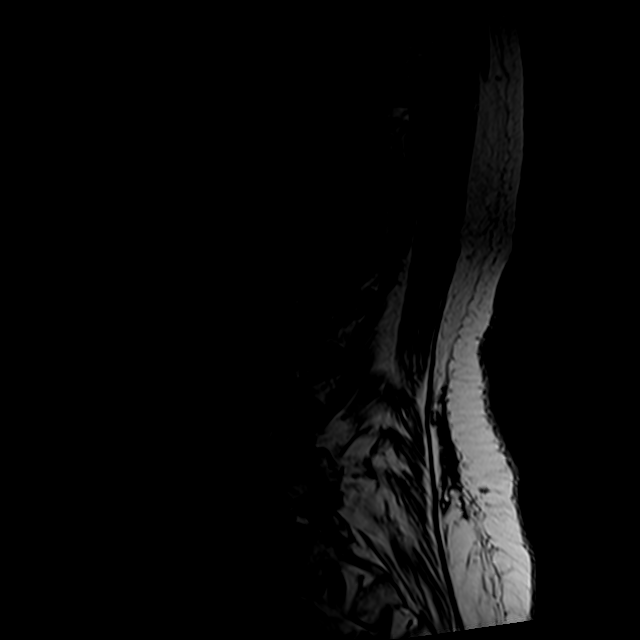
[im 6/15]
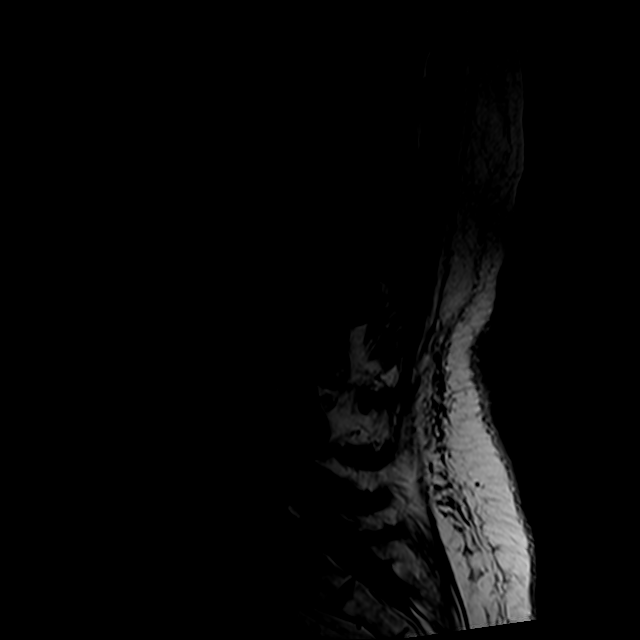
[im 9/15]
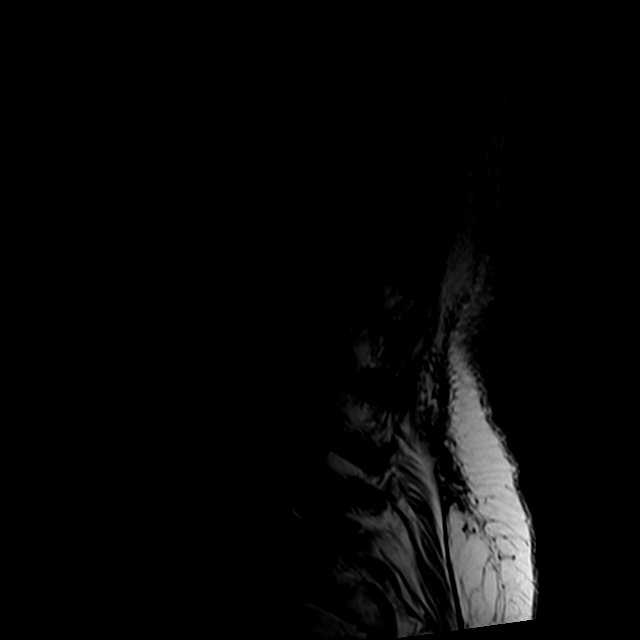
[im 12/15]
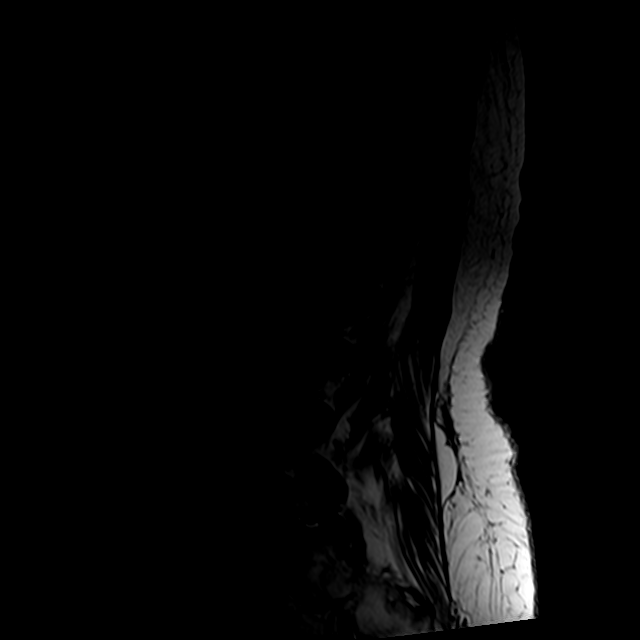
[im 15/15]
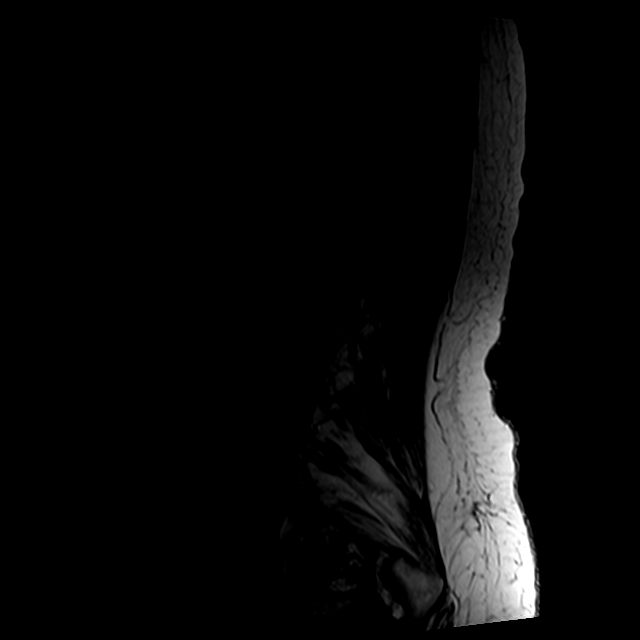

[Series 6: T1 · axial · 4.0mm · 0.39mm/px · z∈[-102,+90]mm · 4 of 35 slices shown (2 of 2)]
[im 1/35]
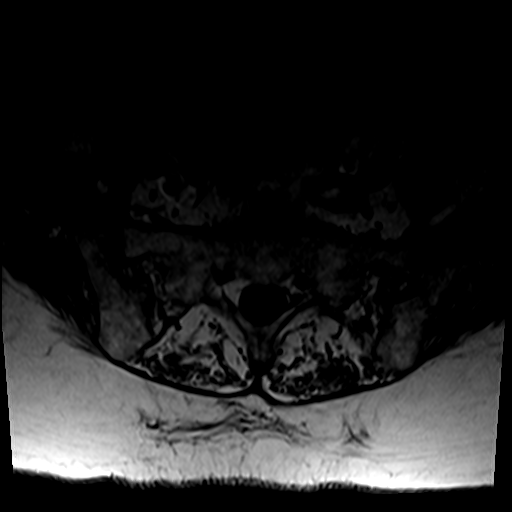
[im 5/35]
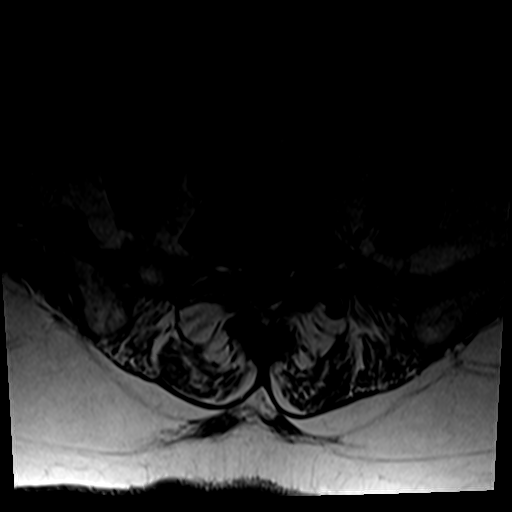
[im 18/35]
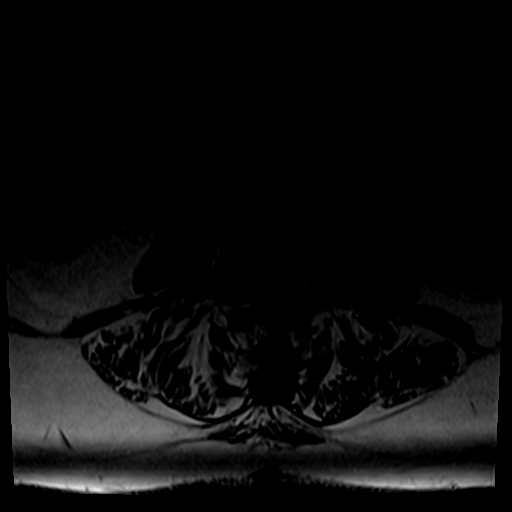
[im 30/35]
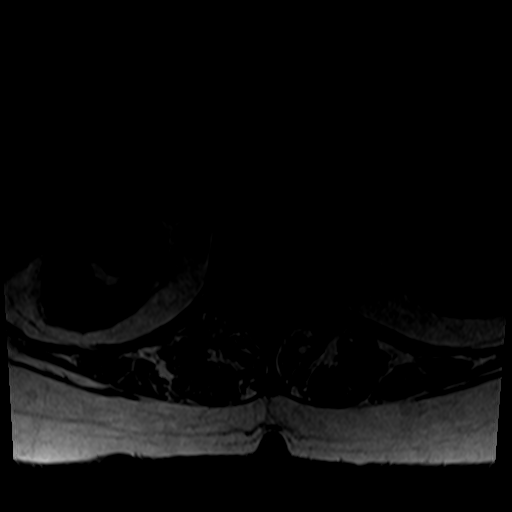

[Series 7: T2 · axial · 4.0mm · 0.78mm/px · z∈[-102,+114]mm · 9 of 35 slices shown (2 of 2)]
[im 1/35]
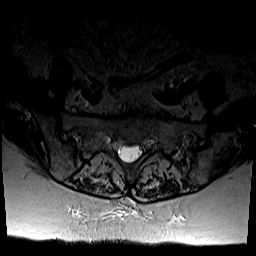
[im 5/35]
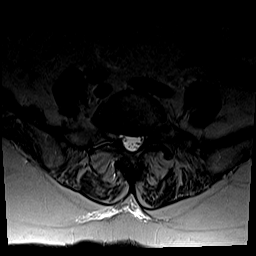
[im 10/35]
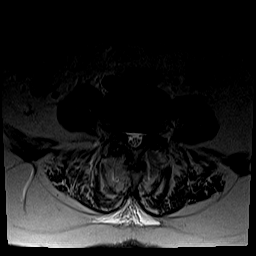
[im 15/35]
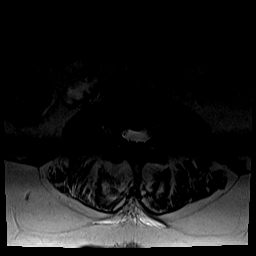
[im 18/35]
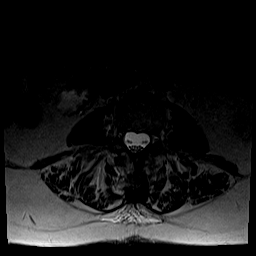
[im 20/35]
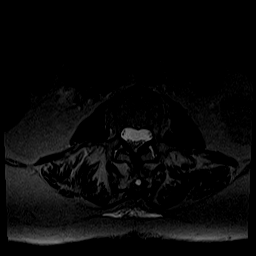
[im 25/35]
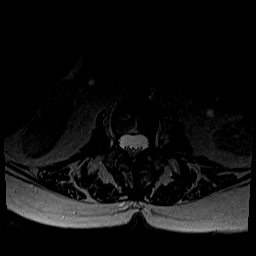
[im 30/35]
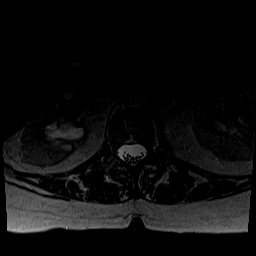
[im 35/35]
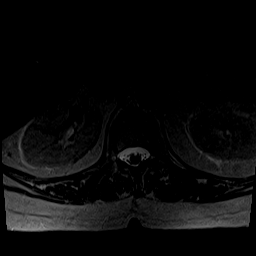

[25 of 48 positions shown; findings below may reference images not displayed]

FINDINGS: Segmentation:  Standard

Alignment: 0.5 cm degenerative retrolisthesis L1 on L2 is
identified. There is also 0.5 cm facet mediated anterolisthesis L4
on L5 and L5 on S1.

Vertebrae:  No fracture or worrisome lesion.

Conus medullaris and cauda equina: Conus extends to the L1 level.
Conus and cauda equina appear normal.

Paraspinal and other soft tissues: Negative.

Disc levels:

T9-10, T10-11 and T11-12 are imaged in the sagittal plane only.
There is a minimal disc bulge at T11-12. No stenosis at any of these
levels.

T12-L1: Negative.

L1-2: Disc and endplate spur eccentric to the left. The central
canal and foramina are open.

L2-3: Shallow disc bulge and mild facet degenerative disease. The
central canal and foramina are open.

L3-4: Shallow disc bulge and mild facet arthropathy without
stenosis.

L4-5: Advanced bilateral facet degenerative change is seen. There is
ligamentum flavum thickening. Anterolisthesis results in some disc
uncovering and there is a shallow bulge. Mild to moderate central
canal stenosis is present with some narrowing in the subarticular
recesses, greater on the right. Mild to moderate foraminal narrowing
is worse on the right.

L5-S1: Bilateral facet degenerative disease. The disc is uncovered
with a shallow bulge but the central canal and foramina remain open.
IMPRESSION: Spondylosis appearing worst at L4-5 where there is mild to moderate
central canal stenosis and bilateral foraminal narrowing, worse on
the right. There is also some narrowing in the subarticular recesses
at this level, greater on the right.

Mild disc bulging L1-2, L2-3 and L3-4 without stenosis.

## 2020-04-26 DIAGNOSIS — I272 Pulmonary hypertension, unspecified: Secondary | ICD-10-CM | POA: Insufficient documentation

## 2020-11-01 ENCOUNTER — Other Ambulatory Visit: Payer: Self-pay | Admitting: Physician Assistant

## 2020-11-01 DIAGNOSIS — R590 Localized enlarged lymph nodes: Secondary | ICD-10-CM

## 2020-11-08 ENCOUNTER — Other Ambulatory Visit: Payer: Self-pay | Admitting: Physician Assistant

## 2020-11-08 ENCOUNTER — Ambulatory Visit
Admission: RE | Admit: 2020-11-08 | Discharge: 2020-11-08 | Disposition: A | Discharge status: Home / Self Care | Payer: Medicare Other | Source: Ambulatory Visit | Attending: Physician Assistant | Admitting: Physician Assistant

## 2020-11-08 ENCOUNTER — Other Ambulatory Visit: Payer: Self-pay

## 2020-11-08 DIAGNOSIS — R590 Localized enlarged lymph nodes: Secondary | ICD-10-CM

## 2021-01-08 ENCOUNTER — Encounter: Payer: Self-pay | Admitting: Emergency Medicine

## 2021-01-08 ENCOUNTER — Ambulatory Visit
Admission: EM | Admit: 2021-01-08 | Discharge: 2021-01-08 | Disposition: A | Discharge status: Home / Self Care | Payer: Medicare Other | Attending: Emergency Medicine | Admitting: Emergency Medicine

## 2021-01-08 ENCOUNTER — Other Ambulatory Visit: Payer: Self-pay

## 2021-01-08 DIAGNOSIS — U071 COVID-19: Secondary | ICD-10-CM

## 2021-01-08 MED ORDER — MOLNUPIRAVIR EUA 200MG CAPSULE: 4.0000 | ORAL_CAPSULE | Freq: Two times a day (BID) | ORAL | 0 refills | Status: AC

## 2021-01-08 NOTE — ED Provider Notes (Signed)
HPI  SUBJECTIVE:  Sonya Matthews is a 87 y.o. female who presents with cough, chills, fevers T-max 101, fatigue, sore throat, nausea starting this morning.  She tested positive for COVID this morning.  Her husband was discharged home from the hospital where he was admitted for COVID yesterday.  She denies body aches, headaches, nasal congestion, rhinorrhea, loss of sense of smell or taste, wheezing, shortness of breath, vomiting, diarrhea, abdominal pain.  No antipyretic in the past 6 hours.  She has tried Mucinex DM and Chloraseptic.  Chloraseptic helps.  There are no aggravating or alleviating factors.  She has had 2 COVID boosters.  She also has a history of diabetes, hypertension, pulmonary hypertension, dysrhythmia.  She is on Eliquis.  PMD: Damaris Schooner, DO   Past Medical History:  Diagnosis Date   Arthritis    Diabetes mellitus without complication (HCC)    no meds   Dysrhythmia    GERD (gastroesophageal reflux disease)    occasional   Heart murmur    Hypertension    Seasonal allergies     Past Surgical History:  Procedure Laterality Date   ABDOMINAL HYSTERECTOMY     APPENDECTOMY     BLADDER SUSPENSION     BREAST BIOPSY Left 2003   neg   BREAST BIOPSY Left 1991   neg-needle bx   CHOLECYSTECTOMY     EYE SURGERY Bilateral    cataract   PARTIAL KNEE ARTHROPLASTY Left 02/05/2020   Procedure: UNICOMPARTMENTAL KNEE;  Surgeon: Christena Flake, MD;  Location: [facility] ORS;  Service: Orthopedics;  Laterality: Left;    Family History  Problem Relation Age of Onset   Breast cancer Mother 74   Breast cancer Maternal Aunt     Social History   Tobacco Use   Smoking status: Never   Smokeless tobacco: Never  Vaping Use   Vaping Use: Never used  Substance Use Topics   Alcohol use: Yes    Comment: rare   Drug use: No    No current facility-administered medications for this encounter.  Current Outpatient Medications:    amLODipine (NORVASC) 10 MG tablet, Take 10 mg by mouth  daily., Disp: , Rfl:    apixaban (ELIQUIS) 2.5 MG TABS tablet, Take 1 tablet (2.5 mg total) by mouth 2 (two) times daily., Disp: 30 tablet, Rfl: 0   atorvastatin (LIPITOR) 20 MG tablet, Take 20 mg by mouth daily. , Disp: , Rfl:    cetirizine (ZYRTEC) 10 MG tablet, Take 10 mg by mouth daily., Disp: , Rfl:    cholecalciferol (VITAMIN D3) 25 MCG (1000 UNIT) tablet, Take 1,000 Units by mouth daily., Disp: , Rfl:    doxepin (SINEQUAN) 50 MG capsule, Take 50 mg by mouth at bedtime as needed. 1-2 as needed for sleep, Disp: , Rfl:    fluticasone (FLONASE) 50 MCG/ACT nasal spray, Place into both nostrils daily. , Disp: , Rfl:    ketotifen (ZADITOR) 0.025 % ophthalmic solution, Place 1 drop into both eyes daily as needed (allergies)., Disp: , Rfl:    losartan (COZAAR) 100 MG tablet, Take 100 mg by mouth daily. am, Disp: , Rfl:    molnupiravir EUA (LAGEVRIO) 200 mg CAPS capsule, Take 4 capsules (800 mg total) by mouth 2 (two) times daily for 5 days., Disp: 40 capsule, Rfl: 0   montelukast (SINGULAIR) 10 MG tablet, Take 10 mg by mouth at bedtime., Disp: , Rfl:    Multiple Vitamin (MULTIVITAMIN WITH MINERALS) TABS tablet, Take 1 tablet by mouth daily.,  Disp: , Rfl:    acetaminophen (TYLENOL) 500 MG tablet, Take 1,000 mg by mouth every 6 (six) hours as needed for moderate pain., Disp: , Rfl:    triamcinolone cream (KENALOG) 0.1 %, Apply 1 application topically 2 (two) times daily as needed for rash., Disp: , Rfl:   Allergies  Allergen Reactions   Thimerosal Rash     ROS  As noted in HPI.   Physical Exam  BP 136/62 (BP Location: Right Arm)   Pulse 71   Temp 99.3 F (37.4 C) (Oral)   Resp 14   Ht 5\' 2"  (1.575 m)   Wt 63.5 kg   SpO2 98%   BMI 25.61 kg/m   Constitutional: Well developed, well nourished, no acute distress. Eyes:  EOMI, conjunctiva normal bilaterally HENT: Normocephalic, atraumatic,mucus membranes moist Respiratory: Normal inspiratory effort lungs clear  bilaterally Cardiovascular: Normal rate, regular rhythm, no murmurs rubs or gallops GI: nondistended skin: No rash, skin intact Musculoskeletal: no deformities Neurologic: Alert & oriented x 3, no focal neuro deficits Psychiatric: Speech and behavior appropriate   ED Course   Medications - No data to display  No orders of the defined types were placed in this encounter.   No results found for this or any previous visit (from the past 24 hour(s)). No results found.  ED Clinical Impression  1. COVID-19 virus infection      ED Assessment/Plan  Patient qualifies for antivirals based on age and comorbidities.  We will send home with Molnupiravir, Tessalon.  Advised her to monitor her pulse ox, to get out and walk/move some every day, Tylenol as needed.  Follow-up with her PMD as needed.  ER return precautions given to patient and daughter.  Discussed  MDM, treatment plan, and plan for follow-up with patient. Discussed sn/sx that should prompt return to the ED. patient agrees with plan.   Meds ordered this encounter  Medications   molnupiravir EUA (LAGEVRIO) 200 mg CAPS capsule    Sig: Take 4 capsules (800 mg total) by mouth 2 (two) times daily for 5 days.    Dispense:  40 capsule    Refill:  0      *This clinic note was created using . Therefore, there may be occasional mistakes despite careful proofreading.  ?    Scientist, clinical (histocompatibility and immunogenetics), MD 01/08/21 [phone]

## 2021-01-08 NOTE — ED Triage Notes (Signed)
Patient c/o cough and fever that started this morning.  Patient states that her husband was diagnosed with COVID on Wed.  Patient states that she did a home COVID test this morning and was positive.

## 2021-01-08 NOTE — Discharge Instructions (Addendum)
The Molnupiravir is an antiviral which will help lessen the duration and severity of the COVID infection.  Make sure you drink plenty of extra fluids.  Tessalon as needed for cough.  Hot tea and honey for the sore throat.  Please get out and move a little bit every day to prevent complications.

## 2021-07-27 ENCOUNTER — Ambulatory Visit (INDEPENDENT_AMBULATORY_CARE_PROVIDER_SITE_OTHER): Payer: Medicare Other

## 2021-07-27 ENCOUNTER — Ambulatory Visit
Admission: EM | Admit: 2021-07-27 | Discharge: 2021-07-27 | Disposition: A | Discharge status: Home / Self Care | Payer: Medicare Other

## 2021-07-27 DIAGNOSIS — M25462 Effusion, left knee: Secondary | ICD-10-CM

## 2021-07-27 DIAGNOSIS — M25562 Pain in left knee: Secondary | ICD-10-CM | POA: Diagnosis not present

## 2021-07-27 NOTE — ED Provider Notes (Signed)
[facility]    CSN: [id number] Arrival date & time: 07/27/21  1311      History   Chief Complaint Chief Complaint  Patient presents with   Knee Pain    HPI Sonya Matthews is a 88 y.o. female.   Pt complains of pain in her left knee.  Pt reports pain began after lifting her husband.  Pt reports when knee twist she has severe pain.  Pt has had knee surgery.  Pt has appointment with her Ortho PA tomorrow.   The history is provided by the patient.  Knee Pain  Past Medical History:  Diagnosis Date   Arthritis    Diabetes mellitus without complication (HCC)    no meds   Dysrhythmia    GERD (gastroesophageal reflux disease)    occasional   Heart murmur    Hypertension    Seasonal allergies     Patient Active Problem List   Diagnosis Date Noted   Pulmonary hypertension (HCC) 04/26/2020   Status post left partial knee replacement 02/05/2020   Chest pain on exertion 05/17/2018   Heart palpitations 05/17/2018   Low back pain with radiation 12/04/2017   Primary osteoarthritis of left knee 11/20/2017   Dermal hypersensitivity reaction 07/23/2017   Allergic rhinitis 02/25/2017   Bilateral lower extremity edema 02/25/2017   Chronic pruritic rash in adult 10/28/2015   Controlled type 2 diabetes mellitus without complication (HCC) 09/04/2013   HTN (hypertension) 09/04/2013   Mixed hyperlipidemia 09/04/2013    Past Surgical History:  Procedure Laterality Date   ABDOMINAL HYSTERECTOMY     APPENDECTOMY     BLADDER SUSPENSION     BREAST BIOPSY Left 2003   neg   BREAST BIOPSY Left 1991   neg-needle bx   CHOLECYSTECTOMY     EYE SURGERY Bilateral    cataract   PARTIAL KNEE ARTHROPLASTY Left 02/05/2020   Procedure: UNICOMPARTMENTAL KNEE;  Surgeon: Christena Flake, MD;  Location: [facility];  Service: Orthopedics;  Laterality: Left;    OB History   No obstetric history on file.      Home Medications    Prior to Admission medications   Medication Sig Start  Date End Date Taking? Authorizing Provider  acetaminophen (TYLENOL) 500 MG tablet Take 1,000 mg by mouth every 6 (six) hours as needed for moderate pain.    [provider]  amLODipine (NORVASC) 10 MG tablet Take 10 mg by mouth daily.    [provider]  apixaban (ELIQUIS) 2.5 MG TABS tablet Take 1 tablet (2.5 mg total) by mouth 2 (two) times daily. 02/05/20   Poggi, Excell Seltzer, MD  atorvastatin (LIPITOR) 20 MG tablet Take 20 mg by mouth daily.     [provider]  cetirizine (ZYRTEC) 10 MG tablet Take 10 mg by mouth daily.    [provider]  cholecalciferol (VITAMIN D3) 25 MCG (1000 UNIT) tablet Take 1,000 Units by mouth daily.    [provider]  clobetasol (TEMOVATE) 0.05 % external solution Apply topically. 03/07/21   [provider]  cyclobenzaprine (FLEXERIL) 5 MG tablet Take by mouth. 08/20/20   [provider]  Cysteamine Bitartrate (PROCYSBI) 300 MG PACK Use 1 each once daily Use as instructed. 12/14/20 12/14/21  [provider]  doxepin (SINEQUAN) 50 MG capsule Take 50 mg by mouth at bedtime as needed. 1-2 as needed for sleep    [provider]  fluticasone (FLONASE) 50 MCG/ACT nasal spray Place into both nostrils daily.  [provider]  glucose blood (PRECISION QID TEST) test strip daily. 12/14/20 12/14/21  [provider]  ketotifen (ZADITOR) 0.025 % ophthalmic solution Place 1 drop into both eyes daily as needed (allergies).    [provider]  Lancets (ONETOUCH DELICA PLUS LANCET33G) MISC USE 1 EACH ONCE DAILY USE AS INSTRUCTED 12/14/20   [provider]  losartan (COZAAR) 100 MG tablet Take 100 mg by mouth daily. am    [provider]  meloxicam (MOBIC) 15 MG tablet Take by mouth. 07/15/21 07/15/22  [provider]  molnupiravir EUA (LAGEVRIO) 200 MG CAPS capsule TAKE 4 CAPSULES BY MOUTH TWICE DAILY FOR 5 DAYS 01/08/21   [provider]  montelukast  (SINGULAIR) 10 MG tablet Take 10 mg by mouth at bedtime.    [provider]  Multiple Vitamin (MULTIVITAMIN WITH MINERALS) TABS tablet Take 1 tablet by mouth daily.    [provider]  Omega-3 Fatty Acids (FISH OIL PO) Take by mouth.    [provider]  oxyCODONE (OXY IR/ROXICODONE) 5 MG immediate release tablet Take by mouth. 02/05/20   [provider]  spironolactone (ALDACTONE) 25 MG tablet Take 12.5 mg by mouth daily. 06/21/21   [provider]  triamcinolone cream (KENALOG) 0.1 % Apply 1 application topically 2 (two) times daily as needed for rash. 09/18/19   [provider]    Family History Family History  Problem Relation Age of Onset   Breast cancer Mother 47   Breast cancer Maternal Aunt     Social History Social History   Tobacco Use   Smoking status: Never   Smokeless tobacco: Never  Vaping Use   Vaping Use: Never used  Substance Use Topics   Alcohol use: Not Currently    Comment: rare   Drug use: No     Allergies   Thimerosal and Wound dressing adhesive   Review of Systems Review of Systems  Musculoskeletal:  Positive for joint swelling.  All other systems reviewed and are negative.   Physical Exam Triage Vital Signs ED Triage Vitals  Enc Vitals Group     BP 07/27/21 1327 (!) 171/45     Pulse Rate 07/27/21 1327 60     Resp 07/27/21 1327 18     Temp 07/27/21 1327 98.2 F (36.8 C)     Temp Source 07/27/21 1327 Oral     SpO2 07/27/21 1327 99 %     Weight 07/27/21 1324 139 lb 15.9 oz (63.5 kg)     Height --      Head Circumference --      Peak Flow --      Pain Score 07/27/21 1324 5     Pain Loc --      Pain Edu? --      Excl. in GC? --    No data found.  Updated Vital Signs BP (!) 172/56 (BP Location: Right Arm)   Pulse 60   Temp 98.2 F (36.8 C) (Oral)   Resp 18   Wt 63.5 kg   SpO2 99%   BMI 25.60 kg/m   Visual Acuity Right Eye Distance:   Left Eye Distance:   Bilateral Distance:     Right Eye Near:   Left Eye Near:    Bilateral Near:     Physical Exam Vitals and nursing note reviewed.  Constitutional:      Appearance: She is well-developed.  HENT:     Head: Normocephalic.  Cardiovascular:  Rate and Rhythm: Normal rate.  Pulmonary:     Effort: Pulmonary effort is normal.  Abdominal:     General: Bowel sounds are normal. There is no distension.  Musculoskeletal:        General: Swelling and tenderness present.     Cervical back: Normal range of motion.     Comments: Swollen tender left knee,  pain with movement,  nv and ns intact   Neurological:     Mental Status: She is alert and oriented to person, place, and time.  Psychiatric:        Mood and Affect: Mood normal.     UC Treatments / Results  Labs (all labs ordered are listed, but only abnormal results are displayed) Labs Reviewed - No data to display  EKG   Radiology DG Knee Complete 4 Views Left  Result Date: 07/27/2021 CLINICAL DATA:  Trauma, pain EXAM: LEFT KNEE - COMPLETE 4+ VIEW COMPARISON:  02/05/2020 FINDINGS: There is previous partial arthroplasty in the medial compartment. No recent fracture or dislocation is seen. Small effusion is present in the suprapatellar bursa. There is 11 mm linear calcific density adjacent to the lateral condyle of the distal femur seen in the sunrise view. Bony spurs seen in the patella. There is linear calcification at the attachment of quadriceps tendon to the patella suggesting calcific tendinosis. IMPRESSION: No recent fracture or dislocation is seen. Small effusion is seen in the suprapatellar bursa. Previous partial arthroplasty in the medial compartment. Degenerative changes with bony spurs are seen in patella. Electronically Signed   By: Ernie Avena M.D.   On: 07/27/2021 13:59    Procedures Procedures (including critical care time)  Medications Ordered in UC Medications - No data to display  Initial Impression / Assessment and Plan / UC  Course  I have reviewed the triage vital signs and the nursing notes.  Pertinent labs & imaging results that were available during my care of the patient were reviewed by me and considered in my medical decision making (see chart for details).     MDM:  Pt has an effusion on xray,  Pt placed in a knee imbolizer.  Pt advised tylenol  followup with Ortho tomorrow as scheduled  Final Clinical Impressions(s) / UC Diagnoses   Final diagnoses:  Effusion of left knee     Discharge Instructions      See your Orthopaedic Pa tomorrow as scheduled    ED Prescriptions   None    PDMP not reviewed this encounter. An After Visit Summary was printed and given to the patient.    Elson Areas, [place] 07/27/21 1526

## 2021-07-27 NOTE — Discharge Instructions (Signed)
See your Orthopaedic Pa tomorrow as scheduled  ?

## 2021-07-27 NOTE — ED Triage Notes (Signed)
Patient is here for "Left knee pain". ? Twisted getting out of bed "yesterday morning".  Mild pain now. Hurt more with movement.  ?

## 2022-05-31 IMAGING — DX DG KNEE 1-2V PORT*L*
2 series · 2 of 2 positions shown · non-contrast
Comparison: MRI 11/07/2017

CLINICAL DATA: Left partial knee replacement

EXAM:
PORTABLE LEFT KNEE - 1-2 VIEW

[knee ap]
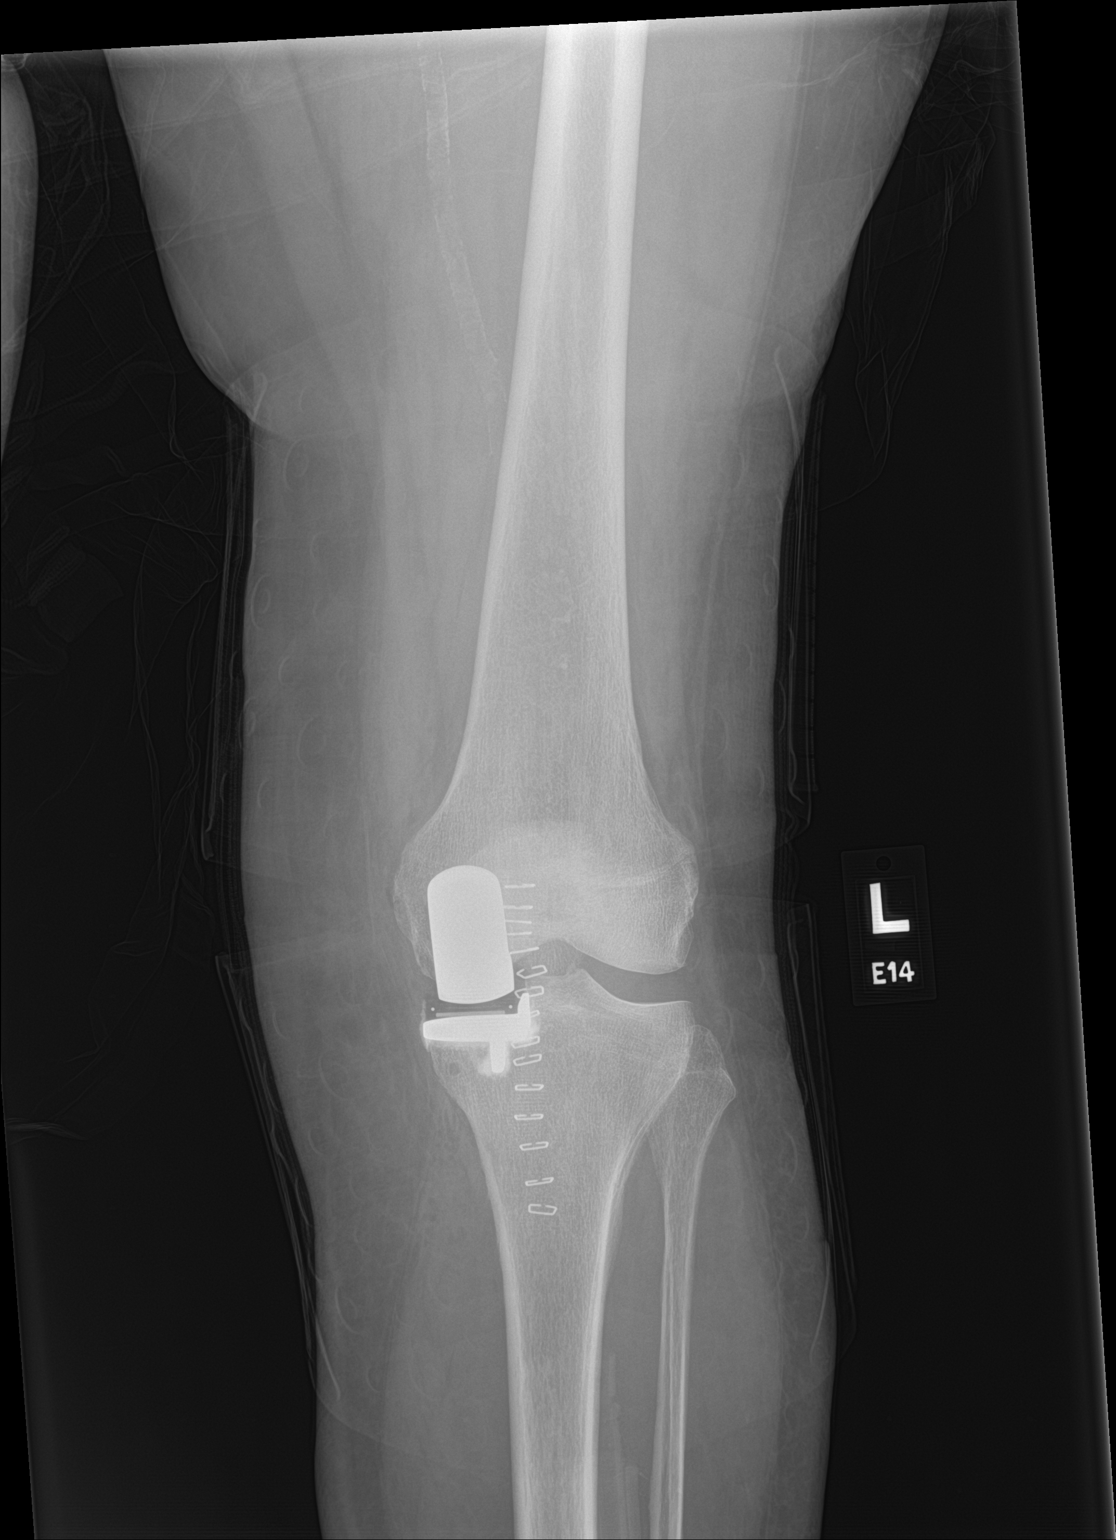

[knee lat]
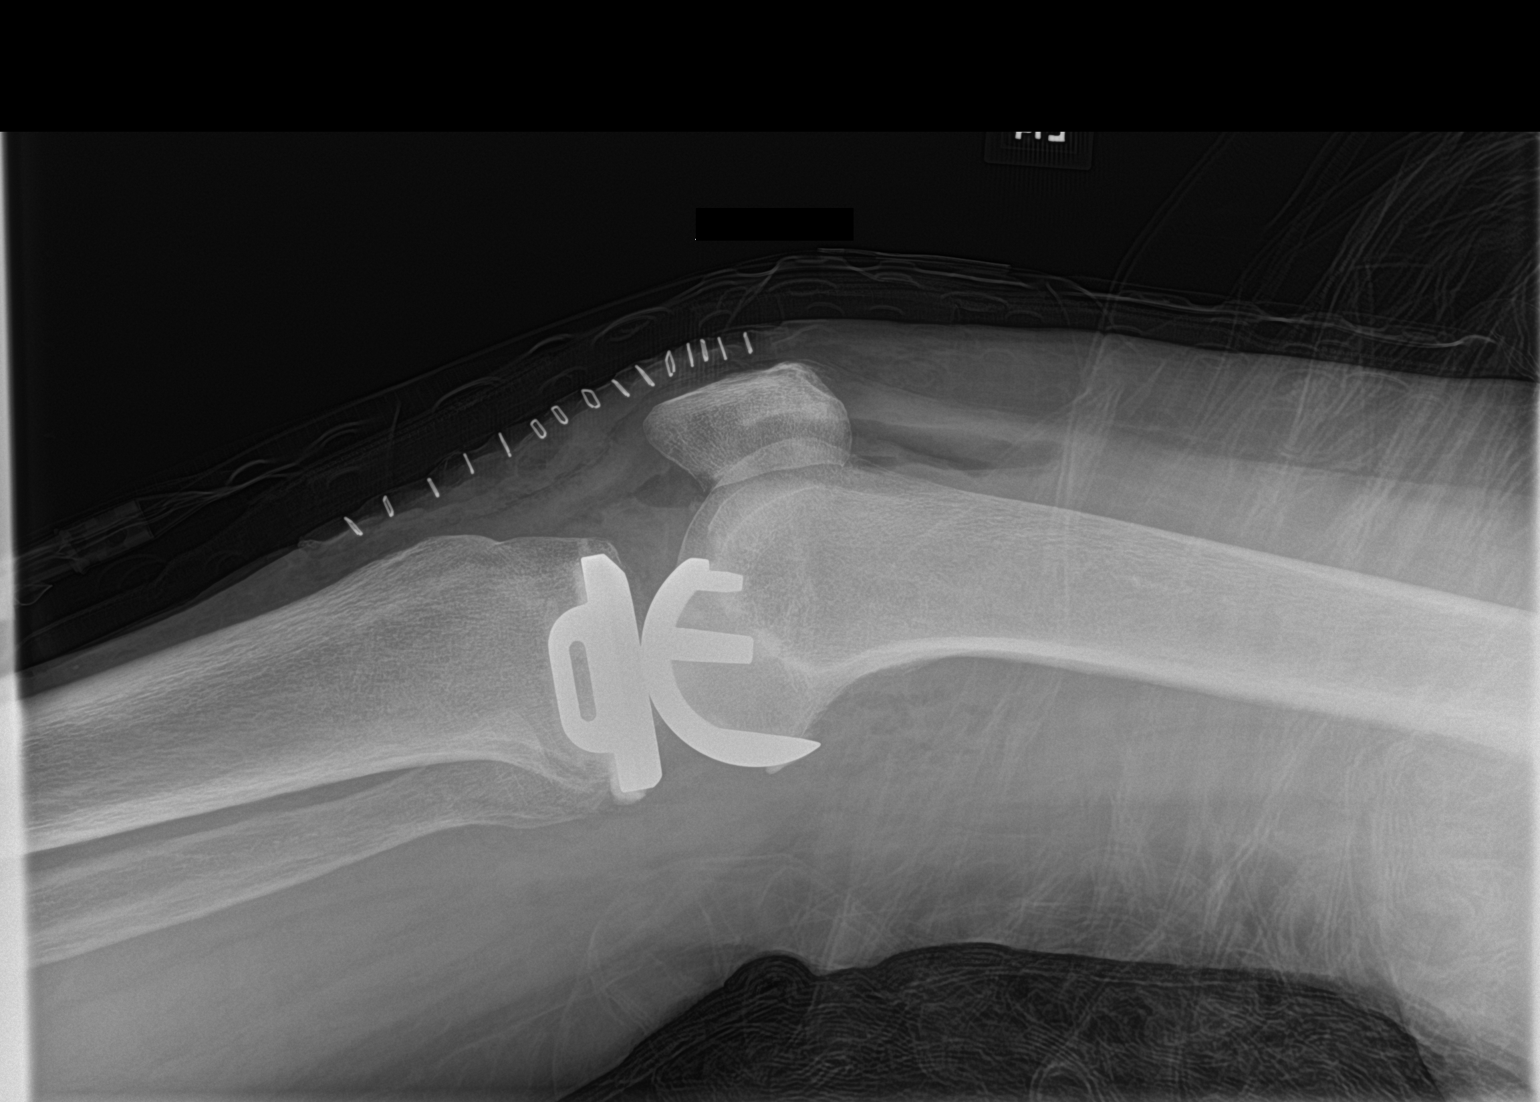

[2 of 2 positions shown; findings below may reference images not displayed]

FINDINGS: Two view radiograph left knee demonstrates surgical changes of
medial compartment unicompartmental knee arthroplasty with
arthroplasty components in expected position. Normal overall
alignment. No unexpected fracture or dislocation. Intra-articular
gas and prepatellar soft tissue defect as well as anterior skin
staples are postsurgical in nature.
IMPRESSION: Unicompartmental left knee arthroplasty in expected position.

## 2022-07-07 ENCOUNTER — Other Ambulatory Visit: Payer: Self-pay | Admitting: Neurology

## 2022-07-07 DIAGNOSIS — G3184 Mild cognitive impairment, so stated: Secondary | ICD-10-CM

## 2022-07-10 ENCOUNTER — Ambulatory Visit
Admission: RE | Admit: 2022-07-10 | Discharge: 2022-07-10 | Disposition: A | Discharge status: Home / Self Care | Payer: Medicare Other | Source: Ambulatory Visit | Attending: Neurology | Admitting: Neurology

## 2022-07-10 DIAGNOSIS — G3184 Mild cognitive impairment, so stated: Secondary | ICD-10-CM | POA: Insufficient documentation

## 2022-12-24 ENCOUNTER — Ambulatory Visit: Payer: Self-pay

## 2022-12-24 ENCOUNTER — Encounter: Payer: Self-pay | Admitting: Emergency Medicine

## 2022-12-24 ENCOUNTER — Ambulatory Visit
Admission: EM | Admit: 2022-12-24 | Discharge: 2022-12-24 | Disposition: A | Discharge status: Home / Self Care | Payer: Medicare Other | Attending: Family Medicine | Admitting: Family Medicine

## 2022-12-24 DIAGNOSIS — N3001 Acute cystitis with hematuria: Secondary | ICD-10-CM | POA: Diagnosis present

## 2022-12-24 DIAGNOSIS — I10 Essential (primary) hypertension: Secondary | ICD-10-CM | POA: Insufficient documentation

## 2022-12-24 LAB — URINALYSIS, W/ REFLEX TO CULTURE (INFECTION SUSPECTED)
Bilirubin Urine: NEGATIVE
Glucose, UA: NEGATIVE mg/dL
Ketones, ur: NEGATIVE mg/dL
Nitrite: NEGATIVE
Protein, ur: 100 mg/dL — AB
RBC / HPF: >50 RBC/hpf (ref 0–5)
Specific Gravity, Urine: 1.025 (ref 1.005–1.030)
pH: 6 (ref 5.0–8.0)

## 2022-12-24 MED ORDER — NITROFURANTOIN MONOHYD MACRO 100 MG PO CAPS: 100.0000 mg | ORAL_CAPSULE | Freq: Two times a day (BID) | ORAL | 0 refills | Status: AC

## 2022-12-24 NOTE — ED Provider Notes (Addendum)
[facility]    CSN: [id number] Arrival date & time: 12/24/22  4098      History   Chief Complaint Chief Complaint  Patient presents with   Dysuria     HPI HPI Sonya Matthews is a 89 y.o. female.    Sonya Matthews presents for dysuria and urinary frequency.  No treatments prior to arrival.  She has not had any antibiotics in the last 30 days.  No hematuria, vaginal discharge.  She does have some back pain but this is also not new for her.  She is concerned as her husband died from bladder cancer.  She does not smoke but he did.  She is postmenopausal.  Has always had trouble with urinary incontinence but has been worse recently.  Notes urinary urgency.        Past Medical History:  Diagnosis Date   Arthritis    Diabetes mellitus without complication (HCC)    no meds   Dysrhythmia    GERD (gastroesophageal reflux disease)    occasional   Heart murmur    Hypertension    Seasonal allergies     Patient Active Problem List   Diagnosis Date Noted   Pulmonary hypertension (HCC) 04/26/2020   Status post left partial knee replacement 02/05/2020   Chest pain on exertion 05/17/2018   Heart palpitations 05/17/2018   Low back pain with radiation 12/04/2017   Primary osteoarthritis of left knee 11/20/2017   Dermal hypersensitivity reaction 07/23/2017   Allergic rhinitis 02/25/2017   Bilateral lower extremity edema 02/25/2017   Chronic pruritic rash in adult 10/28/2015   Controlled type 2 diabetes mellitus without complication (HCC) 09/04/2013   HTN (hypertension) 09/04/2013   Mixed hyperlipidemia 09/04/2013    Past Surgical History:  Procedure Laterality Date   ABDOMINAL HYSTERECTOMY     APPENDECTOMY     BLADDER SUSPENSION     BREAST BIOPSY Left 2003   neg   BREAST BIOPSY Left 1991   neg-needle bx   CHOLECYSTECTOMY     EYE SURGERY Bilateral    cataract   PARTIAL KNEE ARTHROPLASTY Left 02/05/2020   Procedure: UNICOMPARTMENTAL KNEE;  Surgeon: Christena Flake, MD;  Location: [facility];  Service: Orthopedics;  Laterality: Left;    OB History   No obstetric history on file.      Home Medications    Prior to Admission medications   Medication Sig Start Date End Date Taking? Authorizing Provider  nitrofurantoin, macrocrystal-monohydrate, (MACROBID) 100 MG capsule Take 1 capsule (100 mg total) by mouth 2 (two) times daily. 12/24/22  Yes Orley Lawry, Seward Meth, DO  acetaminophen (TYLENOL) 500 MG tablet Take 1,000 mg by mouth every 6 (six) hours as needed for moderate pain.    [provider]  amLODipine (NORVASC) 10 MG tablet Take 10 mg by mouth daily.    [provider]  apixaban (ELIQUIS) 2.5 MG TABS tablet Take 1 tablet (2.5 mg total) by mouth 2 (two) times daily. 02/05/20   Poggi, Excell Seltzer, MD  atorvastatin (LIPITOR) 20 MG tablet Take 20 mg by mouth daily.     [provider]  cetirizine (ZYRTEC) 10 MG tablet Take 10 mg by mouth daily.    [provider]  cholecalciferol (VITAMIN D3) 25 MCG (1000 UNIT) tablet Take 1,000 Units by mouth daily.    [provider]  clobetasol (TEMOVATE) 0.05 % external solution Apply topically. 03/07/21   [provider]  cyclobenzaprine (FLEXERIL) 5 MG tablet Take by mouth.  08/20/20   [provider]  doxepin (SINEQUAN) 50 MG capsule Take 50 mg by mouth at bedtime as needed. 1-2 as needed for sleep    [provider]  fluticasone (FLONASE) 50 MCG/ACT nasal spray Place into both nostrils daily.     [provider]  ketotifen (ZADITOR) 0.025 % ophthalmic solution Place 1 drop into both eyes daily as needed (allergies).    [provider]  Lancets (ONETOUCH DELICA PLUS LANCET33G) MISC USE 1 EACH ONCE DAILY USE AS INSTRUCTED 12/14/20   [provider]  losartan (COZAAR) 100 MG tablet Take 100 mg by mouth daily. am    [provider]  molnupiravir EUA (LAGEVRIO) 200 MG CAPS capsule TAKE 4 CAPSULES BY MOUTH TWICE DAILY FOR 5  DAYS 01/08/21   [provider]  montelukast (SINGULAIR) 10 MG tablet Take 10 mg by mouth at bedtime.    [provider]  Multiple Vitamin (MULTIVITAMIN WITH MINERALS) TABS tablet Take 1 tablet by mouth daily.    [provider]  Omega-3 Fatty Acids (FISH OIL PO) Take by mouth.    [provider]  oxyCODONE (OXY IR/ROXICODONE) 5 MG immediate release tablet Take by mouth. 02/05/20   [provider]  spironolactone (ALDACTONE) 25 MG tablet Take 12.5 mg by mouth daily. 06/21/21   [provider]  triamcinolone cream (KENALOG) 0.1 % Apply 1 application topically 2 (two) times daily as needed for rash. 09/18/19   [provider]    Family History Family History  Problem Relation Age of Onset   Breast cancer Mother 35   Breast cancer Maternal Aunt     Social History Social History   Tobacco Use   Smoking status: Never   Smokeless tobacco: Never  Vaping Use   Vaping status: Never Used  Substance Use Topics   Alcohol use: Not Currently    Comment: rare   Drug use: No     Allergies   Thimerosal (thiomersal) and Wound dressing adhesive   Review of Systems Review of Systems: :negative unless otherwise stated in HPI.      Physical Exam Triage Vital Signs ED Triage Vitals  Encounter Vitals Group     BP 12/24/22 0902 (!) 198/68     Systolic BP Percentile --      Diastolic BP Percentile --      Pulse Rate 12/24/22 0902 60     Resp 12/24/22 0902 14     Temp 12/24/22 0902 97.9 F (36.6 C)     Temp Source 12/24/22 0902 Oral     SpO2 12/24/22 0902 95 %     Weight 12/24/22 0900 139 lb 15.9 oz (63.5 kg)     Height 12/24/22 0900 5\' 2"  (1.575 m)     Head Circumference --      Peak Flow --      Pain Score 12/24/22 0900 6     Pain Loc --      Pain Education --      Exclude from Growth Chart --    No data found.  Updated Vital Signs BP (!) 150/65 (BP Location: Left Arm)   Pulse 60   Temp 97.9 F (36.6 C) (Oral)    Resp 14   Ht 5\' 2"  (1.575 m)   Wt 63.5 kg   SpO2 95%   BMI 25.60 kg/m   Visual Acuity Right Eye Distance:   Left Eye Distance:   Bilateral Distance:    Right Eye Near:  Left Eye Near:    Bilateral Near:     Physical Exam GEN: well appearing female in no acute distress  CVS: well perfused  RESP: speaking in full sentences without pause  ABD: soft, non-tender, non-distended, no palpable masses      UC Treatments / Results  Labs (all labs ordered are listed, but only abnormal results are displayed) Labs Reviewed  URINALYSIS, W/ REFLEX TO CULTURE (INFECTION SUSPECTED) - Abnormal; Notable for the following components:      Result Value   APPearance CLOUDY (*)    Hgb urine dipstick LARGE (*)    Protein, ur 100 (*)    Leukocytes,Ua LARGE (*)    Bacteria, UA MANY (*)    All other components within normal limits  URINE CULTURE    EKG   Radiology No results found.  Procedures Procedures (including critical care time)  Medications Ordered in UC Medications - No data to display  Initial Impression / Assessment and Plan / UC Course  I have reviewed the triage vital signs and the nursing notes.  Pertinent labs & imaging results that were available during my care of the patient were reviewed by me and considered in my medical decision making (see chart for details).     Acute cystitis:  Patient is a 89 y.o. female  who presents for 2 days of dysuria and urinary frequency.   Overall patient is well-appearing and afebrile.  Vital signs stable.  UA consistent with acute cystitis.  Hematuria supported on microscopy.   Treat with Macrobid twice times daily for 5 days. Urine culture obtained.  Follow-up sensitivities and change antibiotics, if needed. Return precautions including abdominal pain, fever, chills, nausea, or vomiting given. Follow-up,  if symptoms not improving or getting worse.   Grasyn is hypertensive here.  BP 198/68 then after sitting was 150/65.  As  prescribed Norvasc 5 mg, losartan 100 mg and takes spironolactone 12.5 mg every other day, per PCP note on 11/16/2022.  Denies needing refills. Recommended she check herblood pressure and follow up with her primary care provider in the next 2 weeks.     Discussed MDM, treatment plan and plan for follow-up with patientwho agrees with plan.        Final Clinical Impressions(s) / UC Diagnoses   Final diagnoses:  Acute cystitis with hematuria  Elevated blood pressure reading with diagnosis of hypertension     Discharge Instructions      Stop by the pharmacy to pick up your prescriptions.  Follow up with your primary care provider as needed.      ED Prescriptions     Medication Sig Dispense Auth. Provider   nitrofurantoin, macrocrystal-monohydrate, (MACROBID) 100 MG capsule Take 1 capsule (100 mg total) by mouth 2 (two) times daily. 10 capsule Katha Cabal, DO      PDMP not reviewed this encounter.     Katha Cabal, DO 12/24/22 1038

## 2022-12-24 NOTE — Discharge Instructions (Addendum)
Stop by the pharmacy to pick up your prescriptions.  Follow up with your primary care provider as needed.  

## 2022-12-24 NOTE — ED Triage Notes (Signed)
Patient c/o dysuria and urinary frequency that started on Friday.

## 2022-12-26 LAB — URINE CULTURE: Culture: >=100000 — AB
